# Patient Record
Sex: Male | Born: 1960 | Race: Black or African American | Hispanic: No | Marital: Married | Smoking: Current every day smoker
Health system: Southern US, Community
[De-identification: ages and names within clinical notes are randomized; demographics above are authoritative.]

## PROBLEM LIST (undated history)

## (undated) DIAGNOSIS — G8929 Other chronic pain: Secondary | ICD-10-CM

## (undated) DIAGNOSIS — K219 Gastro-esophageal reflux disease without esophagitis: Secondary | ICD-10-CM

## (undated) DIAGNOSIS — G4730 Sleep apnea, unspecified: Secondary | ICD-10-CM

## (undated) DIAGNOSIS — R51 Headache: Secondary | ICD-10-CM

## (undated) DIAGNOSIS — M549 Dorsalgia, unspecified: Secondary | ICD-10-CM

## (undated) DIAGNOSIS — G5621 Lesion of ulnar nerve, right upper limb: Principal | ICD-10-CM

## (undated) DIAGNOSIS — R519 Headache, unspecified: Secondary | ICD-10-CM

## (undated) HISTORY — PX: BACK SURGERY: SHX140

## (undated) HISTORY — DX: Dorsalgia, unspecified: M54.9

## (undated) HISTORY — DX: Lesion of ulnar nerve, right upper limb: G56.21

## (undated) HISTORY — DX: Other chronic pain: G89.29

---

## 2006-04-14 ENCOUNTER — Encounter: Admission: RE | Admit: 2006-04-14 | Discharge: 2006-04-14 | Payer: Self-pay | Admitting: Neurology

## 2006-05-06 ENCOUNTER — Encounter: Admission: RE | Admit: 2006-05-06 | Discharge: 2006-05-06 | Payer: Self-pay | Admitting: Neurology

## 2006-08-30 ENCOUNTER — Encounter: Admission: RE | Admit: 2006-08-30 | Discharge: 2006-08-30 | Payer: Self-pay | Admitting: Neurology

## 2007-01-13 ENCOUNTER — Encounter: Admission: RE | Admit: 2007-01-13 | Discharge: 2007-01-13 | Payer: Self-pay | Admitting: Neurology

## 2007-01-27 ENCOUNTER — Encounter: Admission: RE | Admit: 2007-01-27 | Discharge: 2007-01-27 | Payer: Self-pay | Admitting: Neurology

## 2007-04-01 ENCOUNTER — Encounter: Admission: RE | Admit: 2007-04-01 | Discharge: 2007-04-01 | Payer: Self-pay | Admitting: Neurology

## 2007-08-24 ENCOUNTER — Encounter: Admission: RE | Admit: 2007-08-24 | Discharge: 2007-08-24 | Payer: Self-pay | Admitting: Neurology

## 2009-02-26 ENCOUNTER — Ambulatory Visit (HOSPITAL_COMMUNITY): Admission: RE | Admit: 2009-02-26 | Discharge: 2009-02-27 | Payer: Self-pay | Admitting: Neurosurgery

## 2009-05-02 ENCOUNTER — Encounter: Admission: RE | Admit: 2009-05-02 | Discharge: 2009-05-02 | Payer: Self-pay | Admitting: Neurosurgery

## 2009-06-05 ENCOUNTER — Encounter: Admission: RE | Admit: 2009-06-05 | Discharge: 2009-06-05 | Payer: Self-pay | Admitting: Neurosurgery

## 2009-06-19 ENCOUNTER — Encounter: Admission: RE | Admit: 2009-06-19 | Discharge: 2009-06-19 | Payer: Self-pay | Admitting: Neurosurgery

## 2009-09-06 ENCOUNTER — Encounter: Payer: Self-pay | Admitting: Orthopedic Surgery

## 2009-09-06 ENCOUNTER — Ambulatory Visit (HOSPITAL_COMMUNITY): Admission: RE | Admit: 2009-09-06 | Discharge: 2009-09-06 | Payer: Self-pay | Admitting: Neurology

## 2009-10-07 ENCOUNTER — Ambulatory Visit: Payer: Self-pay | Admitting: Orthopedic Surgery

## 2009-10-07 DIAGNOSIS — M67919 Unspecified disorder of synovium and tendon, unspecified shoulder: Secondary | ICD-10-CM | POA: Insufficient documentation

## 2009-10-07 DIAGNOSIS — M719 Bursopathy, unspecified: Secondary | ICD-10-CM

## 2009-11-20 ENCOUNTER — Encounter: Payer: Self-pay | Admitting: Orthopedic Surgery

## 2009-12-04 ENCOUNTER — Encounter: Payer: Self-pay | Admitting: Orthopedic Surgery

## 2010-03-15 ENCOUNTER — Encounter: Payer: Self-pay | Admitting: Neurology

## 2010-03-16 ENCOUNTER — Encounter: Payer: Self-pay | Admitting: Neurology

## 2010-03-25 NOTE — Letter (Signed)
Summary: Medical record request Disab Determination  Medical record request Disab Determination   Imported By: Cammie Sickle 12/27/2009 12:24:15  _____________________________________________________________________  External Attachment:    Type:   Image     Comment:   External Document

## 2010-03-25 NOTE — Letter (Signed)
Summary: Medical record request Disability Determ  Medical record request Disability Determ   Imported By: Cammie Sickle 01/22/2010 18:44:30  _____________________________________________________________________  External Attachment:    Type:   Image     Comment:   External Document

## 2010-03-25 NOTE — Assessment & Plan Note (Signed)
Summary: rt shoulder pain/xrays [facility]/[facility]   Vital Signs:  Patient profile:   50 year old male Height:      73 inches Weight:      306 pounds Pulse rate:   78 / minute Resp:     18 per minute  Vitals Entered By: Fuller Canada MD (October 07, 2009 11:32 AM)  Visit Type:  new patient Referring Provider:  Dr. Gerilyn Pilgrim Primary Provider:  A. Leavy Cella, [facility] [place]  CC:  right shoulder pain.  History of Present Illness: I saw Omar Bailey in the office today for an initial visit.  He is a 50 years old man with the complaint of:  right shoulder pain.  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  He had an MRI done in July of this year and it showed he had a rim rent tear of the supraspinatus which is seensupraspinatus and infraspinatus tendinitis and a.c. joint inflammation.  He seems to be having more pain than that.  He is taking Percocet 7.5 mg and that doesn't help his shoulder.  He has sharp stabbing constant pain in the RIGHT shoulder and loss of motion with no history of trauma.  At the [facility] he had an MRI of his neck and was told he had arthritis there.  He has not had physical therapy or injection in the shoulder he says his pain is 9/10 and 10 out of 10 when he tries to move his shoulder or lift his arm  Meds: Imitrex, Gabapentin, Flexeril, Stadol, Actonel, Nortriptyline, Allegra, Flonase, Percocet from [place].  the nerve study showed that he has a cubital tunnel type lesion although symptoms seem to be median nerve I do not have the study       Allergies (verified): 1)  ! Sulfa  Past History:  Past Medical History: back migraines rt shoulder  Past Surgical History: lower back 02/26/09  Family History: FH of Cancer:  Family History of Arthritis Hx, family, chronic respiratory condition  Social History: Patient is married.  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 Cardiovascular:  Denies chest pain, palpitations, fainting, and murmurs. Respiratory:  Denies short of breath, wheezing, couch, tightness, pain on inspiration, and snoring . Gastrointestinal:  Denies heartburn, nausea, vomiting, diarrhea, constipation, and blood in your stools. Genitourinary:  Denies frequency, urgency, difficulty urinating, painful urination, flank pain, and bleeding in urine. Neurologic:  Complains of numbness and tremors; denies tingling, unsteady gait, dizziness, and seizure. Musculoskeletal:  Complains of joint pain, instability, stiffness, and muscle pain; denies swelling, redness, and heat. Endocrine:  Denies excessive thirst, exessive urination, and heat or cold intolerance. Psychiatric:  Denies nervousness, depression, anxiety, and hallucinations. Skin:  Denies changes in the skin, poor healing, rash, itching, and redness. HEENT:  Denies blurred or double vision, eye pain, redness, and watering. Immunology:  Complains of seasonal allergies; denies sinus problems and allergic to bee stings. Hemoatologic:  Denies easy bleeding and brusing.  Physical Exam  Additional Exam:  vital signs as recorded he is a large male he is well-developed vacuuming and hygiene are normal  He has normal perfusion and his upper extremity good pulse no swelling  Cervical spine no lymph nodes are palpable, clavicular region and axilla normal.  Skin normal neck shoulder and back.  Neuro  has normal reflexes  Psychiatric he's awake alert and oriented x3 his mood and affect is normal  He ambulates with a cane in the RIGHT hand has been advised to switch it to the LEFT  On inspection there is no specific tenderness around the shoulder but he has active range of motion of 90 forward elevation 50 external rotation and internal rotation only to his front  pocket passively flexion equals 150 painful area pain in the arc of 90-150.  Impingement sign positive.  However he had a normal supraspinatus resistance test grade 5 strength.  External rotation and internal rotation grade 5 strength.  He had painful passive internal rotation to the back pocket.  The shoulder was stable.     Impression & Recommendations:  Problem # 1:  ROTATOR CUFF SYNDROME, RIGHT (ICD-726.10) Assessment New  impingement syndrome rotator cuff syndrome RIGHT shoulder with small rotator cuff tear  RIGHT shoulder subacromial space injection   Verbal consent obtained/The shoulder was injected with depomedrol 40mg /cc and sensorcaine .25% . There were no complications RIGHT shoulder  RIGHT shoulder AP lateral x-ray  in the acromion is type II.  TheArch is slightly altered with suggestive proximal migration of the shoulder.  Impression normal glenohumeral joint with type II acromion  MRI report and films reviewed small partial-thickness tear with some a.c. joint inflammation  I do not think I can make this better with surgery.  He should have physical therapy first subacromial injection was given.  I explained this to him.  I will see him in 2 months he how he does with the therapy.  He does not appear to be a great surgical candidate.  Orders: Physical Therapy Referral (PT) New Patient Level III (98119) Joint Aspirate / Injection, Large (20610) Depo- Medrol 40mg  (J1030) Shoulder x-ray,  minimum 2 views (14782)  Problem # 2:  ROTATOR CUFF INJURY, RIGHT SHOULDER (ICD-726.10) Assessment: New  Patient Instructions: 1)  PT AT [facility]  2)  You have received an injection of cortisone today. You may experience increased pain at the injection site. Apply ice pack to the area for 20 minutes every 2 hours and take 2 xtra strength tylenol every 8 hours. This increased pain will usually resolve in 24 hours. The injection will take effect in 3-10 days.  3)  You have a  small tear in your rotator cuff. If does not seem to need repair. So I am recommending therapy 1st  4)  Please schedule a follow-up appointment in 2 months.

## 2010-03-25 NOTE — Letter (Signed)
Summary: History form  History form   Imported By: Omar Bailey 10/08/2009 12:07:42  _____________________________________________________________________  External Attachment:    Type:   Image     Comment:   External Document

## 2010-05-26 LAB — CBC
HCT: 48.8 % (ref 39.0–52.0)
MCHC: 34.3 g/dL (ref 30.0–36.0)
RBC: 4.71 MIL/uL (ref 4.22–5.81)
RDW: 13.5 % (ref 11.5–15.5)

## 2015-05-13 ENCOUNTER — Encounter: Payer: Self-pay | Admitting: Neurology

## 2015-05-13 ENCOUNTER — Ambulatory Visit (INDEPENDENT_AMBULATORY_CARE_PROVIDER_SITE_OTHER): Payer: Federal, State, Local not specified - PPO | Admitting: Neurology

## 2015-05-13 ENCOUNTER — Ambulatory Visit (INDEPENDENT_AMBULATORY_CARE_PROVIDER_SITE_OTHER): Payer: Self-pay | Admitting: Neurology

## 2015-05-13 DIAGNOSIS — G5621 Lesion of ulnar nerve, right upper limb: Secondary | ICD-10-CM | POA: Diagnosis not present

## 2015-05-13 HISTORY — DX: Lesion of ulnar nerve, right upper limb: G56.21

## 2015-05-13 NOTE — Progress Notes (Signed)
Please refer to EMG and nerve conduction study procedure note. 

## 2015-05-13 NOTE — Procedures (Signed)
     HISTORY:  Annita Brodony Chenier is a 55 year old gentleman with a history of numbness in the fourth and fifth fingers of the right hand since May 2016. The patient has weakness in the hand, dropping things at times. He is being evaluated for this issue. He has also reports some neck and shoulder discomfort and pain down the right arm.  NERVE CONDUCTION STUDIES:  Nerve conduction studies were performed on both upper extremities. The distal motor latencies and motor amplitudes for the median nerves were normal bilaterally. The distal motor latencies for the ulnar nerves were normal bilaterally with a low motor amplitudes on the right, normal on the left. The F wave latencies for the median nerves and for the left ulnar nerve were normal, prolonged on the right. The nerve conduction velocities for the median nerves bilaterally and for the left ulnar nerve were normal, slowed above and below the elbow on the right. The sensory latencies for the median nerves were normal bilaterally, and normal for the left ulnar nerve, prolonged for the right ulnar nerve.  EMG STUDIES:  EMG study was performed on the right upper extremity:  The first dorsal interosseous muscle reveals 2 to 4 K units with significantly reduced recruitment. 3+ fibrillations and positive waves were noted. The abductor pollicis brevis muscle reveals 2 to 4 K units with full recruitment. No fibrillations or positive waves were noted. The extensor indicis proprius muscle reveals 1 to 3 K units with full recruitment. No fibrillations or positive waves were noted. The pronator teres muscle reveals 2 to 3 K units with full recruitment. No fibrillations or positive waves were noted.  The flexor digitorum profundus muscle (III-IV) reveals 2 to 3 K units with full recruitment. No fibrillations or positive waves were seen. The biceps muscle reveals 1 to 2 K units with full recruitment. No fibrillations or positive waves were noted. The triceps  muscle reveals 2 to 4 K units with full recruitment. No fibrillations or positive waves were noted. The anterior deltoid muscle reveals 2 to 3 K units with full recruitment. No fibrillations or positive waves were noted. The cervical paraspinal muscles were tested at 2 levels. No abnormalities of insertional activity were seen at either level tested. There was poor relaxation.   IMPRESSION:  Nerve conduction studies done on the upper extremities shows evidence of a significant right ulnar neuropathy. EMG evaluation of the right upper extremity shows findings consistent with a right ulnar neuropathy, but there appears to be sparing of the flexor digitorum profundus muscle suggesting that the lesion in the ulnar nerve is distal to the cubital tunnel. There is no evidence of an overlying cervical radiculopathy.  Marlan Palau. Keith Willis MD 05/13/2015 1:34 PM  [facility] [address] [place], [place] [id number]  Phone [phone] Fax [phone]

## 2015-05-15 ENCOUNTER — Encounter: Payer: Federal, State, Local not specified - PPO | Admitting: Neurology

## 2017-01-06 ENCOUNTER — Ambulatory Visit: Payer: Federal, State, Local not specified - PPO | Admitting: Gastroenterology

## 2017-01-06 ENCOUNTER — Other Ambulatory Visit: Payer: Self-pay | Admitting: *Deleted

## 2017-01-06 ENCOUNTER — Telehealth: Payer: Self-pay | Admitting: *Deleted

## 2017-01-06 ENCOUNTER — Encounter: Payer: Self-pay | Admitting: *Deleted

## 2017-01-06 ENCOUNTER — Encounter: Payer: Self-pay | Admitting: Gastroenterology

## 2017-01-06 DIAGNOSIS — R1013 Epigastric pain: Secondary | ICD-10-CM

## 2017-01-06 DIAGNOSIS — K5900 Constipation, unspecified: Secondary | ICD-10-CM

## 2017-01-06 DIAGNOSIS — R634 Abnormal weight loss: Secondary | ICD-10-CM | POA: Insufficient documentation

## 2017-01-06 DIAGNOSIS — R93429 Abnormal radiologic findings on diagnostic imaging of unspecified kidney: Secondary | ICD-10-CM | POA: Diagnosis not present

## 2017-01-06 DIAGNOSIS — R933 Abnormal findings on diagnostic imaging of other parts of digestive tract: Secondary | ICD-10-CM

## 2017-01-06 DIAGNOSIS — D7589 Other specified diseases of blood and blood-forming organs: Secondary | ICD-10-CM | POA: Diagnosis not present

## 2017-01-06 DIAGNOSIS — K838 Other specified diseases of biliary tract: Secondary | ICD-10-CM

## 2017-01-06 DIAGNOSIS — K625 Hemorrhage of anus and rectum: Secondary | ICD-10-CM

## 2017-01-06 MED ORDER — PANTOPRAZOLE SODIUM 40 MG PO TBEC: 40.0000 mg | DELAYED_RELEASE_TABLET | Freq: Two times a day (BID) | ORAL | 5 refills | Status: AC

## 2017-01-06 MED ORDER — PEG 3350-KCL-NA BICARB-NACL 420 G PO SOLR: 4000.0000 mL | Freq: Once | ORAL | 0 refills | Status: AC

## 2017-01-06 MED ORDER — LUBIPROSTONE 24 MCG PO CAPS: 24.0000 ug | ORAL_CAPSULE | Freq: Two times a day (BID) | ORAL | 5 refills | Status: DC

## 2017-01-06 NOTE — Progress Notes (Signed)
requested

## 2017-01-06 NOTE — Progress Notes (Signed)
Primary Care Physician:  Encarnacion SlatesBoyd, Amy H, PA-C  Primary Gastroenterologist:  Jonette EvaSandi Fields, MD   Chief Complaint  Patient presents with  . Abdominal Pain    for several months  . Rectal Bleeding    stools black; never had tcs  . Constipation    HPI:  Omar Bailey is a 56 y.o. male here for further evaluation of abdominal pain, rectal bleeding, constipation.  He was advised to make an appointment with us after [facility] ED [facility].   Most recently seen on November 5 at their facility.  He complains of 1921-month history of epigastric pain/left upper quadrant pain, lower abdominal discomfort.  Associated with decreased appetite but no vomiting.  Feels like his food is not digesting well, complains of early satiety and bloating.  He will go days without a BM.  Occasionally uses milk of magnesia.  Has had to disimpact himself.  Noted bright red blood per rectum when he did this.  Stools have been black off and on for months.  He notes that he has used Pepto-Bismol at least recently.  He has had a lot of heartburn.  Some solid food dysphasia.  Recently treated for bronchitis with prednisone and Levaquin.  Far as his constipation is had issues mostly over the past year.  He has been on chronic pain medication for at least 3 years.  Recently found to have H. pylori to the ED.Treated with amoxicillin, clarithromycin.  Patient believes he took pantoprazole 40 mg twice daily with it.  I received records from the latest ED visit dated 12/28/2016.  CT that day outlined below but basically indicated interval improvement in gastric wall thickening but not resolved recommending close endoscopic follow-up to rule out underlying malignancy.  He had stable appearance of mild distention of the extrahepatic bile duct measuring up to 11 mm.  Gallbladder unremarkable.  2 small renal lesions noted.  Labs indicated no evidence of anemia but he had macrocytosis with MCV of 105.  No prior EGD or colonoscopy.    No asa/nsaids.  Only occasional alcohol use, last beer in the summer   Patient reports 40 pound weight loss since July.  Current Outpatient Medications  Medication Sig Dispense Refill  . gabapentin (NEURONTIN) 300 MG capsule Take 1-2 capsules as needed by mouth. Takes 5-6 per day    . HYDROcodone-acetaminophen (NORCO/VICODIN) 5-325 MG tablet Take 1 tablet every 6 (six) hours as needed by mouth for moderate pain.    . SUMAtriptan (IMITREX) 6 MG/0.5ML SOLN injection Inject 6 mg as needed into the skin for migraine or headache. May repeat in 2 hours if headache persists or recurs.     No current facility-administered medications for this visit.     Allergies as of 01/06/2017 - Review Complete 01/06/2017  Allergen Reaction Noted  . Sulfonamide derivatives      Past Medical History:  Diagnosis Date  . Chronic back pain   . Chronic pain    right shoulder, elbow, wrist  . Ulnar neuropathy of right upper extremity 05/13/2015    Past Surgical History:  Procedure Laterality Date  . BACK SURGERY      Family History  Problem Relation Age of Onset  . Colon cancer Neg Hx   . Ulcers Neg Hx     Social History   Socioeconomic History  . Marital status: Married    Spouse name: Not on file  . Number of children: Not on file  . Years of education: Not on file  .  Highest education level: Not on file  Social Needs  . Financial resource strain: Not on file  . Food insecurity - worry: Not on file  . Food insecurity - inability: Not on file  . Transportation needs - medical: Not on file  . Transportation needs - non-medical: Not on file  Occupational History  . Not on file  Tobacco Use  . Smoking status: Former Smoker    Types: Cigarettes  . Smokeless tobacco: Never Used  Substance and Sexual Activity  . Alcohol use: Yes    Comment: occ beer  . Drug use: No  . Sexual activity: Not on file  Other Topics Concern  . Not on file  Social History Narrative  . Not on file       ROS:  General: Negative for  fever, chills, fatigue, weakness.  See HPI Eyes: Negative for vision changes.  ENT: Negative for hoarseness, difficulty swallowing , nasal congestion. CV: Negative for chest pain, angina, palpitations, dyspnea on exertion, peripheral edema.  Respiratory: Negative for dyspnea at rest, dyspnea on exertion, cough, sputum, wheezing.  GI: See history of present illness. GU:  Negative for dysuria, hematuria, urinary incontinence, urinary frequency, nocturnal urination.  MS: Chronic joint pain (right sided shoulder, elbow, wrist) Janese Banks/back pain \ Derm: Negative for rash or itching.  Neuro: Negative for weakness, abnormal sensation, seizure, frequent headaches, memory loss, confusion.  Psych: Negative for anxiety, depression, suicidal ideation, hallucinations.  Endo: See HPI Heme: Negative for bruising or bleeding. Allergy: Negative for rash or hives.    Physical Examination:  BP 129/78   Pulse 98   Temp (!) 97.5 F (36.4 C) (Oral)   Ht 6' (1.829 m)   Wt 218 lb 12.8 oz (99.2 kg)   BMI 29.67 kg/m    General: Well-nourished, well-developed in no acute distress.  Scented with wife Head: Normocephalic, atraumatic.   Eyes: Conjunctiva pink, no icterus. Mouth: Oropharyngeal mucosa moist and pink , no lesions erythema or exudate. Neck: Supple without thyromegaly, masses, or lymphadenopathy.  Lungs: Clear to auscultation bilaterally.  Heart: Regular rate and rhythm, no murmurs rubs or gallops.  Abdomen: Bowel sounds are normal, moderate epigastric tenderness, nondistended, no hepatosplenomegaly or masses, no abdominal bruits or    hernia , no rebound or guarding.   Rectal: Deferred Extremities: No lower extremity edema. No clubbing or deformities.  Neuro: Alert and oriented x 4 , grossly normal neurologically.  Skin: Warm and dry, no rash or jaundice.   Psych: Alert and cooperative, normal mood and affect.  Labs: Labs dated 12/28/2016 at [facility]  ER  Glucose 123, BUN 11, creatinine 1.42, total bilirubin 0.5, alkaline phosphatase 96, AST 14.2, ALT 10, albumin 4.1, lipase 26, white blood cell count 9000, hemoglobin 17.8, MCV 105.5, platelets 314,000  Imaging Studies: CT abdomen and pelvis with contrast dated 12/28/2016 compared to [facility]  Asymmetric elevation left hemidiaphragm.  Mild distention extrahepatic common duct up to 11 mm, 5 mm nonobstructing renal stone in the right kidney, stomach is nondistended with gastric wall thickening seen on previous study appears to be improved but not resolved, tiny hypoattenuating lesion lower pole right kidney stable.  14 mm hypoattenuating lesion in the interpolar left kidney suggestive of cyst.

## 2017-01-06 NOTE — Assessment & Plan Note (Signed)
2 small spots on both kidneys.  Obtain copy of prior CT reports.  Further recommendations to follow.

## 2017-01-06 NOTE — Progress Notes (Signed)
cc'ed to pcp °

## 2017-01-06 NOTE — Telephone Encounter (Signed)
[facility] Pre-op appt scheduled for 01/28/17 at 11:00am over at [facility] short stay. Letter mailed to pt.

## 2017-01-06 NOTE — Assessment & Plan Note (Signed)
Possibly related to opioids although he states he tolerated well up until this past year.  Begin Amitiza 24 mcg twice daily with food.  Samples and prescription provided.  Reiterated need to adequately manage constipation especially prior to colonoscopy bowel prep.  Patient voiced understanding.

## 2017-01-06 NOTE — Assessment & Plan Note (Addendum)
Several month history of progressive epigastric pain associated with early satiety, new onset heartburn, solid food dysphagia, 40 pound weight loss.  CT scan with abnormality of the stomach noted.  Questionable melena although hemoglobin has been normal.  Recommend upper endoscopy with possible esophageal dilation in the near future Dr. Darrick PennaFields, deep sedation given chronic narcotics.  I have discussed the risks, alternatives, benefits with regards to but not limited to the risk of reaction to medication, bleeding, infection, perforation and the patient is agreeable to proceed. Written consent to be obtained.  Resume pantoprazole at 40 mg twice daily before meals.  Prescription sent to pharmacy.  Recommended completing Carafate tablets that he already has but make suspension.  Bland diet.

## 2017-01-06 NOTE — Assessment & Plan Note (Signed)
Rectal bleeding in the setting of constipation.  No prior colonoscopy.  Recommend colonoscopy in the near future.  Deep sedation given narcotics.  I have discussed the risks, alternatives, benefits with regards to but not limited to the risk of reaction to medication, bleeding, infection, perforation and the patient is agreeable to proceed. Written consent to be obtained.

## 2017-01-06 NOTE — Assessment & Plan Note (Signed)
Denies alcohol use.  Check B12 and folate.

## 2017-01-06 NOTE — Assessment & Plan Note (Signed)
Common bile duct 11 mm without detectable etiology on CT scan.  LFTs unremarkable.  Will likely need MRCP at a later date after  endoscopic evaluation.

## 2017-01-06 NOTE — Progress Notes (Addendum)
Darl PikesSusan, please request copy of ultrasounds or CT scans of the [facility] done in last 6 months.

## 2017-01-06 NOTE — Patient Instructions (Signed)
1. Start Amitiza 24 mcg twice daily with food for constipation.  Samples provided.  Prescription sent to pharmacy. 2. Start pantoprazole 40 mg twice daily, 30 minutes before breakfast and 30 minutes before evening meal for your stomach.  Prescription sent to pharmacy. 3. Crush sulcrafate (use what you already have at home) in small amount of water and take one before meals three times a day.  4. Upper endoscopy and colonoscopy as outlined.  Please see separate instructions.  Make sure your bowels are moving well prior to your bowel prep for your procedure.  If you are having troubles let me know. 5. See below diet.   Bland Diet A bland diet consists of foods that do not have a lot of fat or fiber. Foods without fat or fiber are easier for the body to digest. They are also less likely to irritate your mouth, throat, stomach, and other parts of your gastrointestinal tract. A bland diet is sometimes called a BRAT diet. What is my plan? Your health care provider or dietitian may recommend specific changes to your diet to prevent and treat your symptoms, such as:  Eating small meals often.  Cooking food until it is soft enough to chew easily.  Chewing your food well.  Drinking fluids slowly.  Not eating foods that are very spicy, sour, or fatty.  Not eating citrus fruits, such as oranges and grapefruit.  What do I need to know about this diet?  Eat a variety of foods from the bland diet food list.  Do not follow a bland diet longer than you have to.  Ask your health care provider whether you should take vitamins. What foods can I eat? Grains  Hot cereals, such as cream of wheat. Bread, crackers, or tortillas made from refined white flour. Rice. Vegetables Canned or cooked vegetables. Mashed or boiled potatoes. Fruits Bananas. Applesauce. Other types of cooked or canned fruit with the skin and seeds removed, such as canned peaches or pears. Meats and Other Protein Sources Scrambled  eggs. Creamy peanut butter or other nut butters. Lean, well-cooked meats, such as chicken or fish. Tofu. Soups or broths. Dairy Low-fat dairy products, such as milk, cottage cheese, or yogurt. Beverages Water. Herbal tea. Apple juice. Sweets and Desserts Pudding. Custard. Fruit gelatin. Ice cream. Fats and Oils Mild salad dressings. Canola or olive oil. The items listed above may not be a complete list of allowed foods or beverages. Contact your dietitian for more options. What foods are not recommended? Foods and ingredients that are often not recommended include:  Spicy foods, such as hot sauce or salsa.  Fried foods.  Sour foods, such as pickled or fermented foods.  Raw vegetables or fruits, especially citrus or berries.  Caffeinated drinks.  Alcohol.  Strongly flavored seasonings or condiments.  The items listed above may not be a complete list of foods and beverages that are not allowed. Contact your dietitian for more information. This information is not intended to replace advice given to you by your health care provider. Make sure you discuss any questions you have with your health care provider. Document Released: 06/03/2015 Document Revised: 07/18/2015 Document Reviewed: 02/21/2014 Elsevier Interactive Patient Education  2018 [employer]

## 2017-01-11 ENCOUNTER — Telehealth: Payer: Self-pay | Admitting: Gastroenterology

## 2017-01-11 NOTE — Telephone Encounter (Signed)
Pt is needing Amitiza samples to hold him until the pharmacy/insurance gets worked out. [phone]

## 2017-01-12 NOTE — Telephone Encounter (Signed)
I called and spoke to pt's wife, pt is asleep. She said she did not know what kind of problem with insurance, but she will call the pharmacy and have them contact us if he needs PA or let us know what we need to do. I am leaving Amitiza 24 mcg # 12 at front for pick up.

## 2017-01-12 NOTE — Telephone Encounter (Signed)
FYI to Tana CoastLeslie Lewis, PA who saw pt in the office.

## 2017-01-12 NOTE — Telephone Encounter (Signed)
Noted  

## 2017-01-13 ENCOUNTER — Telehealth: Payer: Self-pay | Admitting: Gastroenterology

## 2017-01-13 NOTE — Telephone Encounter (Signed)
Pt's wife called to say that the prescription of Amitiza was too expensive and would cost them $186. She was asking if there's something else not so expensive the provider could call in to [facility]. Please advise [phone]

## 2017-01-13 NOTE — Telephone Encounter (Signed)
PT's wife is aware I will send note to provider. But I am also leaving samples of the Amitiza 24 mcg #12 at front since we are going into holidays. She said she will be by in the next 30 min to pick up.

## 2017-01-17 MED ORDER — LINACLOTIDE 290 MCG PO CAPS: 290.0000 ug | ORAL_CAPSULE | Freq: Every day | ORAL | 5 refills | Status: AC

## 2017-01-17 NOTE — Telephone Encounter (Signed)
Will send in [employer], I'm not sure what is on his formulary. Have them call if any problems.

## 2017-01-17 NOTE — Addendum Note (Signed)
Addended by: Tiffany KocherLEWIS, Janeisha Ryle S on: 01/17/2017 09:45 AM   Modules accepted: Orders

## 2017-01-18 ENCOUNTER — Telehealth: Payer: Self-pay | Admitting: Gastroenterology

## 2017-01-18 NOTE — Telephone Encounter (Signed)
[phone] please call patient, his prescription is very expensive and wants to know if we have a discount card that can be used.

## 2017-01-19 NOTE — Telephone Encounter (Signed)
LMOM to call.

## 2017-01-19 NOTE — Telephone Encounter (Signed)
Returned call and LMOM for a return call.  

## 2017-01-19 NOTE — Telephone Encounter (Signed)
Patient spouse. calling back wanting to discuss Rx sent in. Linzess is still expensive.

## 2017-01-19 NOTE — Telephone Encounter (Signed)
See separate message.

## 2017-01-20 NOTE — Telephone Encounter (Signed)
PT's wife said she paid $116.00 for the Linzess and they would like to know if there is anything else he can get cheaper.  Forwarding to Tana CoastLeslie Lewis, PA who saw pt in the office.

## 2017-01-20 NOTE — Telephone Encounter (Signed)
Please find out what is on his formulary.

## 2017-01-21 NOTE — Telephone Encounter (Signed)
I called [facility] and they said they only got the rejection. York SpanielSaid it is best if the pt calls and asks what else is on the formulary. I called and told pt's wife and she is going to check it out.  Also, I told them we have the savings cards and to ask if he would be eligible for future refills. They will let us know when they find out.

## 2017-01-26 NOTE — Patient Instructions (Signed)
Omar Bailey  01/26/2017     @PREFPERIOPPHARMACY @   Your procedure is scheduled on  02/02/2017 .  Report to [facility] at  1000  A.M.  Call this number if you have problems the morning of surgery:  [phone]   Remember:  Do not eat food or drink liquids after midnight.  Take these medicines the morning of surgery with A SIP OF WATER  Neurontin, hydrocodone, protonix.   Do not wear jewelry, make-up or nail polish.  Do not wear lotions, powders, or perfumes, or deoderant.  Do not shave 48 hours prior to surgery.  Men may shave face and neck.  Do not bring valuables to the hospital.  [facility] is not responsible for any belongings or valuables.  Contacts, dentures or bridgework may not be worn into surgery.  Leave your suitcase in the car.  After surgery it may be brought to your room.  For patients admitted to the hospital, discharge time will be determined by your treatment team.  Patients discharged the day of surgery will not be allowed to drive home.   Name and phone number of your driver:   family Special instructions:  Follow the diet and prep instructions given to you by Dr Evelina Dun office.  Please read over the following fact sheets that you were given. Anesthesia Post-op Instructions and Care and Recovery After Surgery       Esophagogastroduodenoscopy Esophagogastroduodenoscopy (EGD) is a procedure to examine the lining of the esophagus, stomach, and first part of the small intestine (duodenum). This procedure is done to check for problems such as inflammation, bleeding, ulcers, or growths. During this procedure, a long, flexible, lighted tube with a camera attached (endoscope) is inserted down the throat. Tell a health care provider about:  Any allergies you have.  All medicines you are taking, including vitamins, herbs, eye drops, creams, and over-the-counter medicines.  Any problems you or family members have had with  anesthetic medicines.  Any blood disorders you have.  Any surgeries you have had.  Any medical conditions you have.  Whether you are pregnant or may be pregnant. What are the risks? Generally, this is a safe procedure. However, problems may occur, including:  Infection.  Bleeding.  A tear (perforation) in the esophagus, stomach, or duodenum.  Trouble breathing.  Excessive sweating.  Spasms of the larynx.  A slowed heartbeat.  Low blood pressure.  What happens before the procedure?  Follow instructions from your health care provider about eating or drinking restrictions.  Ask your health care provider about: ? Changing or stopping your regular medicines. This is especially important if you are taking diabetes medicines or blood thinners. ? Taking medicines such as aspirin and ibuprofen. These medicines can thin your blood. Do not take these medicines before your procedure if your health care provider instructs you not to.  Plan to have someone take you home after the procedure.  If you wear dentures, be ready to remove them before the procedure. What happens during the procedure?  To reduce your risk of infection, your health care team will wash or sanitize their hands.  An IV tube will be put in a vein in your hand or arm. You will get medicines and fluids through this tube.  You will be given one or more of the following: ? A medicine to help you relax (sedative). ? A medicine to numb the area (local  anesthetic). This medicine may be sprayed into your throat. It will make you feel more comfortable and keep you from gagging or coughing during the procedure. ? A medicine for pain.  A mouth guard may be placed in your mouth to protect your teeth and to keep you from biting on the endoscope.  You will be asked to lie on your left side.  The endoscope will be lowered down your throat into your esophagus, stomach, and duodenum.  Air will be put into the endoscope.  This will help your health care provider see better.  The lining of your esophagus, stomach, and duodenum will be examined.  Your health care provider may: ? Take a tissue sample so it can be looked at in a lab (biopsy). ? Remove growths. ? Remove objects (foreign bodies) that are stuck. ? Treat any bleeding with medicines or other devices that stop tissue from bleeding. ? Widen (dilate) or stretch narrowed areas of your esophagus and stomach.  The endoscope will be taken out. The procedure may vary among health care providers and hospitals. What happens after the procedure?  Your blood pressure, heart rate, breathing rate, and blood oxygen level will be monitored often until the medicines you were given have worn off.  Do not eat or drink anything until the numbing medicine has worn off and your gag reflex has returned. This information is not intended to replace advice given to you by your health care provider. Make sure you discuss any questions you have with your health care provider. Document Released: 06/12/2004 Document Revised: 07/18/2015 Document Reviewed: 01/03/2015 Elsevier Interactive Patient Education  2018 [employer]. Esophagogastroduodenoscopy, Care After Refer to this sheet in the next few weeks. These instructions provide you with information about caring for yourself after your procedure. Your health care provider may also give you more specific instructions. Your treatment has been planned according to current medical practices, but problems sometimes occur. Call your health care provider if you have any problems or questions after your procedure. What can I expect after the procedure? After the procedure, it is common to have:  A sore throat.  Nausea.  Bloating.  Dizziness.  Fatigue.  Follow these instructions at home:  Do not eat or drink anything until the numbing medicine (local anesthetic) has worn off and your gag reflex has returned. You will know  that the local anesthetic has worn off when you can swallow comfortably.  Do not drive for 24 hours if you received a medicine to help you relax (sedative).  If your health care provider took a tissue sample for testing during the procedure, make sure to get your test results. This is your responsibility. Ask your health care provider or the department performing the test when your results will be ready.  Keep all follow-up visits as told by your health care provider. This is important. Contact a health care provider if:  You cannot stop coughing.  You are not urinating.  You are urinating less than usual. Get help right away if:  You have trouble swallowing.  You cannot eat or drink.  You have throat or chest pain that gets worse.  You are dizzy or light-headed.  You faint.  You have nausea or vomiting.  You have chills.  You have a fever.  You have severe abdominal pain.  You have black, tarry, or bloody stools. This information is not intended to replace advice given to you by your health care provider. Make sure you discuss any  questions you have with your health care provider. Document Released: 01/27/2012 Document Revised: 07/18/2015 Document Reviewed: 01/03/2015 Elsevier Interactive Patient Education  2018 [employer].  Esophageal Dilatation Esophageal dilatation is a procedure to open a blocked or narrowed part of the esophagus. The esophagus is the long tube in your throat that carries food and liquid from your mouth to your stomach. The procedure is also called esophageal dilation. You may need this procedure if you have a buildup of scar tissue in your esophagus that makes it difficult, painful, or even impossible to swallow. This can be caused by gastroesophageal reflux disease (GERD). In rare cases, people need this procedure because they have cancer of the esophagus or a problem with the way food moves through the esophagus. Sometimes you may need to have  another dilatation to enlarge the opening of the esophagus gradually. Tell a health care provider about:  Any allergies you have.  All medicines you are taking, including vitamins, herbs, eye drops, creams, and over-the-counter medicines.  Any problems you or family members have had with anesthetic medicines.  Any blood disorders you have.  Any surgeries you have had.  Any medical conditions you have.  Any antibiotic medicines you are required to take before dental procedures. What are the risks? Generally, this is a safe procedure. However, problems can occur and include:  Bleeding from a tear in the lining of the esophagus.  A hole (perforation) in the esophagus.  What happens before the procedure?  Do not eat or drink anything after midnight on the night before the procedure or as directed by your health care provider.  Ask your health care provider about changing or stopping your regular medicines. This is especially important if you are taking diabetes medicines or blood thinners.  Plan to have someone take you home after the procedure. What happens during the procedure?  You will be given a medicine that makes you relaxed and sleepy (sedative).  A medicine may be sprayed or gargled to numb the back of the throat.  Your health care provider can use various instruments to do an esophageal dilatation. During the procedure, the instrument used will be placed in your mouth and passed down into your esophagus. Options include: ? Simple dilators. This instrument is carefully placed in the esophagus to stretch it. ? Guided wire bougies. In this method, a flexible tube (endoscope) is used to insert a wire into the esophagus. The dilator is passed over this wire to enlarge the esophagus. Then the wire is removed. ? Balloon dilators. An endoscope with a small balloon at the end is passed down into the esophagus. Inflating the balloon gently stretches the esophagus and opens it  up. What happens after the procedure?  Your blood pressure, heart rate, breathing rate, and blood oxygen level will be monitored often until the medicines you were given have worn off.  Your throat may feel slightly sore and will probably still feel numb. This will improve slowly over time.  You will not be allowed to eat or drink until the throat numbness has resolved.  If this is a same-day procedure, you may be allowed to go home once you have been able to drink, urinate, and sit on the edge of the bed without nausea or dizziness.  If this is a same-day procedure, you should have a friend or family member with you for the next 24 hours after the procedure. This information is not intended to replace advice given to you by your  health care provider. Make sure you discuss any questions you have with your health care provider. Document Released: 04/02/2005 Document Revised: 07/18/2015 Document Reviewed: 06/21/2013 Elsevier Interactive Patient Education  2017 [employer]  Colonoscopy, Adult A colonoscopy is an exam to look at the entire large intestine. During the exam, a lubricated, bendable tube is inserted into the anus and then passed into the rectum, colon, and other parts of the large intestine. A colonoscopy is often done as a part of normal colorectal screening or in response to certain symptoms, such as anemia, persistent diarrhea, abdominal pain, and blood in the stool. The exam can help screen for and diagnose medical problems, including:  Tumors.  Polyps.  Inflammation.  Areas of bleeding.  Tell a health care provider about:  Any allergies you have.  All medicines you are taking, including vitamins, herbs, eye drops, creams, and over-the-counter medicines.  Any problems you or family members have had with anesthetic medicines.  Any blood disorders you have.  Any surgeries you have had.  Any medical conditions you have.  Any problems you have had passing  stool. What are the risks? Generally, this is a safe procedure. However, problems may occur, including:  Bleeding.  A tear in the intestine.  A reaction to medicines given during the exam.  Infection (rare).  What happens before the procedure? Eating and drinking restrictions Follow instructions from your health care provider about eating and drinking, which may include:  A few days before the procedure - follow a low-fiber diet. Avoid nuts, seeds, dried fruit, raw fruits, and vegetables.  1-3 days before the procedure - follow a clear liquid diet. Drink only clear liquids, such as clear broth or bouillon, black coffee or tea, clear juice, clear soft drinks or sports drinks, gelatin dessert, and popsicles. Avoid any liquids that contain red or purple dye.  On the day of the procedure - do not eat or drink anything during the 2 hours before the procedure, or within the time period that your health care provider recommends.  Bowel prep If you were prescribed an oral bowel prep to clean out your colon:  Take it as told by your health care provider. Starting the day before your procedure, you will need to drink a large amount of medicated liquid. The liquid will cause you to have multiple loose stools until your stool is almost clear or light green.  If your skin or anus gets irritated from diarrhea, you may use these to relieve the irritation: ? Medicated wipes, such as adult wet wipes with aloe and vitamin E. ? A skin soothing-product like petroleum jelly.  If you vomit while drinking the bowel prep, take a break for up to 60 minutes and then begin the bowel prep again. If vomiting continues and you cannot take the bowel prep without vomiting, call your health care provider.  General instructions  Ask your health care provider about changing or stopping your regular medicines. This is especially important if you are taking diabetes medicines or blood thinners.  Plan to have someone  take you home from the hospital or clinic. What happens during the procedure?  An IV tube may be inserted into one of your veins.  You will be given medicine to help you relax (sedative).  To reduce your risk of infection: ? Your health care team will wash or sanitize their hands. ? Your anal area will be washed with soap.  You will be asked to lie on your side with  your knees bent.  Your health care provider will lubricate a long, thin, flexible tube. The tube will have a camera and a light on the end.  The tube will be inserted into your anus.  The tube will be gently eased through your rectum and colon.  Air will be delivered into your colon to keep it open. You may feel some pressure or cramping.  The camera will be used to take images during the procedure.  A small tissue sample may be removed from your body to be examined under a microscope (biopsy). If any potential problems are found, the tissue will be sent to a lab for testing.  If small polyps are found, your health care provider may remove them and have them checked for cancer cells.  The tube that was inserted into your anus will be slowly removed. The procedure may vary among health care providers and hospitals. What happens after the procedure?  Your blood pressure, heart rate, breathing rate, and blood oxygen level will be monitored until the medicines you were given have worn off.  Do not drive for 24 hours after the exam.  You may have a small amount of blood in your stool.  You may pass gas and have mild abdominal cramping or bloating due to the air that was used to inflate your colon during the exam.  It is up to you to get the results of your procedure. Ask your health care provider, or the department performing the procedure, when your results will be ready. This information is not intended to replace advice given to you by your health care provider. Make sure you discuss any questions you have with your  health care provider. Document Released: 02/07/2000 Document Revised: 12/11/2015 Document Reviewed: 04/23/2015 Elsevier Interactive Patient Education  2018 [employer].  Colonoscopy, Adult, Care After This sheet gives you information about how to care for yourself after your procedure. Your health care provider may also give you more specific instructions. If you have problems or questions, contact your health care provider. What can I expect after the procedure? After the procedure, it is common to have:  A small amount of blood in your stool for 24 hours after the procedure.  Some gas.  Mild abdominal cramping or bloating.  Follow these instructions at home: General instructions   For the first 24 hours after the procedure: ? Do not drive or use machinery. ? Do not sign important documents. ? Do not drink alcohol. ? Do your regular daily activities at a slower pace than normal. ? Eat soft, easy-to-digest foods. ? Rest often.  Take over-the-counter or prescription medicines only as told by your health care provider.  It is up to you to get the results of your procedure. Ask your health care provider, or the department performing the procedure, when your results will be ready. Relieving cramping and bloating  Try walking around when you have cramps or feel bloated.  Apply heat to your abdomen as told by your health care provider. Use a heat source that your health care provider recommends, such as a moist heat pack or a heating pad. ? Place a towel between your skin and the heat source. ? Leave the heat on for 20-30 minutes. ? Remove the heat if your skin turns bright red. This is especially important if you are unable to feel pain, heat, or cold. You may have a greater risk of getting burned. Eating and drinking  Drink enough fluid to keep your  urine clear or pale yellow.  Resume your normal diet as instructed by your health care provider. Avoid heavy or fried foods that  are hard to digest.  Avoid drinking alcohol for as long as instructed by your health care provider. Contact a health care provider if:  You have blood in your stool 2-3 days after the procedure. Get help right away if:  You have more than a small spotting of blood in your stool.  You pass large blood clots in your stool.  Your abdomen is swollen.  You have nausea or vomiting.  You have a fever.  You have increasing abdominal pain that is not relieved with medicine. This information is not intended to replace advice given to you by your health care provider. Make sure you discuss any questions you have with your health care provider. Document Released: 09/24/2003 Document Revised: 11/04/2015 Document Reviewed: 04/23/2015 Elsevier Interactive Patient Education  2018 [employer]  Monitored Anesthesia Care Anesthesia is a term that refers to techniques, procedures, and medicines that help a person stay safe and comfortable during a medical procedure. Monitored anesthesia care, or sedation, is one type of anesthesia. Your anesthesia specialist may recommend sedation if you will be having a procedure that does not require you to be unconscious, such as:  Cataract surgery.  A dental procedure.  A biopsy.  A colonoscopy.  During the procedure, you may receive a medicine to help you relax (sedative). There are three levels of sedation:  Mild sedation. At this level, you may feel awake and relaxed. You will be able to follow directions.  Moderate sedation. At this level, you will be sleepy. You may not remember the procedure.  Deep sedation. At this level, you will be asleep. You will not remember the procedure.  The more medicine you are given, the deeper your level of sedation will be. Depending on how you respond to the procedure, the anesthesia specialist may change your level of sedation or the type of anesthesia to fit your needs. An anesthesia specialist will monitor you  closely during the procedure. Let your health care provider know about:  Any allergies you have.  All medicines you are taking, including vitamins, herbs, eye drops, creams, and over-the-counter medicines.  Any use of steroids (by mouth or as a cream).  Any problems you or family members have had with sedatives and anesthetic medicines.  Any blood disorders you have.  Any surgeries you have had.  Any medical conditions you have, such as sleep apnea.  Whether you are pregnant or may be pregnant.  Any use of cigarettes, alcohol, or street drugs. What are the risks? Generally, this is a safe procedure. However, problems may occur, including:  Getting too much medicine (oversedation).  Nausea.  Allergic reaction to medicines.  Trouble breathing. If this happens, a breathing tube may be used to help with breathing. It will be removed when you are awake and breathing on your own.  Heart trouble.  Lung trouble.  Before the procedure Staying hydrated Follow instructions from your health care provider about hydration, which may include:  Up to 2 hours before the procedure - you may continue to drink clear liquids, such as water, clear fruit juice, black coffee, and plain tea.  Eating and drinking restrictions Follow instructions from your health care provider about eating and drinking, which may include:  8 hours before the procedure - stop eating heavy meals or foods such as meat, fried foods, or fatty foods.  6 hours  before the procedure - stop eating light meals or foods, such as toast or cereal.  6 hours before the procedure - stop drinking milk or drinks that contain milk.  2 hours before the procedure - stop drinking clear liquids.  Medicines Ask your health care provider about:  Changing or stopping your regular medicines. This is especially important if you are taking diabetes medicines or blood thinners.  Taking medicines such as aspirin and ibuprofen. These  medicines can thin your blood. Do not take these medicines before your procedure if your health care provider instructs you not to.  Tests and exams  You will have a physical exam.  You may have blood tests done to show: ? How well your kidneys and liver are working. ? How well your blood can clot.  General instructions  Plan to have someone take you home from the hospital or clinic.  If you will be going home right after the procedure, plan to have someone with you for 24 hours.  What happens during the procedure?  Your blood pressure, heart rate, breathing, level of pain and overall condition will be monitored.  An IV tube will be inserted into one of your veins.  Your anesthesia specialist will give you medicines as needed to keep you comfortable during the procedure. This may mean changing the level of sedation.  The procedure will be performed. After the procedure  Your blood pressure, heart rate, breathing rate, and blood oxygen level will be monitored until the medicines you were given have worn off.  Do not drive for 24 hours if you received a sedative.  You may: ? Feel sleepy, clumsy, or nauseous. ? Feel forgetful about what happened after the procedure. ? Have a sore throat if you had a breathing tube during the procedure. ? Vomit. This information is not intended to replace advice given to you by your health care provider. Make sure you discuss any questions you have with your health care provider. Document Released: 11/05/2004 Document Revised: 07/19/2015 Document Reviewed: 06/02/2015 Elsevier Interactive Patient Education  2018 [employer] Monitored Anesthesia Care, Care After These instructions provide you with information about caring for yourself after your procedure. Your health care provider may also give you more specific instructions. Your treatment has been planned according to current medical practices, but problems sometimes occur. Call your health  care provider if you have any problems or questions after your procedure. What can I expect after the procedure? After your procedure, it is common to:  Feel sleepy for several hours.  Feel clumsy and have poor balance for several hours.  Feel forgetful about what happened after the procedure.  Have poor judgment for several hours.  Feel nauseous or vomit.  Have a sore throat if you had a breathing tube during the procedure.  Follow these instructions at home: For at least 24 hours after the procedure:   Do not: ? Participate in activities in which you could fall or become injured. ? Drive. ? Use heavy machinery. ? Drink alcohol. ? Take sleeping pills or medicines that cause drowsiness. ? Make important decisions or sign legal documents. ? Take care of children on your own.  Rest. Eating and drinking  Follow the diet that is recommended by your health care provider.  If you vomit, drink water, juice, or soup when you can drink without vomiting.  Make sure you have little or no nausea before eating solid foods. General instructions  Have a responsible adult stay with you  until you are awake and alert.  Take over-the-counter and prescription medicines only as told by your health care provider.  If you smoke, do not smoke without supervision.  Keep all follow-up visits as told by your health care provider. This is important. Contact a health care provider if:  You keep feeling nauseous or you keep vomiting.  You feel light-headed.  You develop a rash.  You have a fever. Get help right away if:  You have trouble breathing. This information is not intended to replace advice given to you by your health care provider. Make sure you discuss any questions you have with your health care provider. Document Released: 06/02/2015 Document Revised: 10/02/2015 Document Reviewed: 06/02/2015 Elsevier Interactive Patient Education  [employer].

## 2017-01-28 ENCOUNTER — Other Ambulatory Visit: Payer: Self-pay

## 2017-01-28 ENCOUNTER — Encounter (HOSPITAL_COMMUNITY)
Admission: RE | Admit: 2017-01-28 | Discharge: 2017-01-28 | Disposition: A | Discharge status: Home / Self Care | Payer: Federal, State, Local not specified - PPO | Source: Ambulatory Visit | Attending: Gastroenterology | Admitting: Gastroenterology

## 2017-01-28 ENCOUNTER — Telehealth: Payer: Self-pay

## 2017-01-28 ENCOUNTER — Encounter (HOSPITAL_COMMUNITY): Payer: Self-pay

## 2017-01-28 DIAGNOSIS — R718 Other abnormality of red blood cells: Secondary | ICD-10-CM | POA: Diagnosis not present

## 2017-01-28 DIAGNOSIS — G4730 Sleep apnea, unspecified: Secondary | ICD-10-CM | POA: Diagnosis not present

## 2017-01-28 DIAGNOSIS — R634 Abnormal weight loss: Secondary | ICD-10-CM | POA: Diagnosis not present

## 2017-01-28 DIAGNOSIS — R9431 Abnormal electrocardiogram [ECG] [EKG]: Secondary | ICD-10-CM | POA: Diagnosis not present

## 2017-01-28 DIAGNOSIS — F17210 Nicotine dependence, cigarettes, uncomplicated: Secondary | ICD-10-CM | POA: Diagnosis not present

## 2017-01-28 DIAGNOSIS — E876 Hypokalemia: Secondary | ICD-10-CM | POA: Diagnosis not present

## 2017-01-28 DIAGNOSIS — R799 Abnormal finding of blood chemistry, unspecified: Secondary | ICD-10-CM | POA: Diagnosis present

## 2017-01-28 DIAGNOSIS — R109 Unspecified abdominal pain: Secondary | ICD-10-CM | POA: Diagnosis not present

## 2017-01-28 DIAGNOSIS — I452 Bifascicular block: Secondary | ICD-10-CM | POA: Diagnosis not present

## 2017-01-28 DIAGNOSIS — D7589 Other specified diseases of blood and blood-forming organs: Secondary | ICD-10-CM

## 2017-01-28 DIAGNOSIS — N179 Acute kidney failure, unspecified: Secondary | ICD-10-CM | POA: Diagnosis not present

## 2017-01-28 DIAGNOSIS — R339 Retention of urine, unspecified: Secondary | ICD-10-CM | POA: Diagnosis not present

## 2017-01-28 DIAGNOSIS — Z79899 Other long term (current) drug therapy: Secondary | ICD-10-CM | POA: Diagnosis not present

## 2017-01-28 DIAGNOSIS — E538 Deficiency of other specified B group vitamins: Secondary | ICD-10-CM | POA: Diagnosis not present

## 2017-01-28 HISTORY — DX: Headache: R51

## 2017-01-28 HISTORY — DX: Headache, unspecified: R51.9

## 2017-01-28 HISTORY — DX: Sleep apnea, unspecified: G47.30

## 2017-01-28 HISTORY — DX: Gastro-esophageal reflux disease without esophagitis: K21.9

## 2017-01-28 LAB — VITAMIN B12: VITAMIN B 12: 313 pg/mL (ref 180–914)

## 2017-01-28 LAB — COMPREHENSIVE METABOLIC PANEL
ALT: 12 U/L — ABNORMAL LOW (ref 17–63)
ANION GAP: 11 (ref 5–15)
AST: 17 U/L (ref 15–41)
Albumin: 4 g/dL (ref 3.5–5.0)
Alkaline Phosphatase: 88 U/L (ref 38–126)
BUN: 15 mg/dL (ref 6–20)
CHLORIDE: 98 mmol/L — AB (ref 101–111)
CO2: 32 mmol/L (ref 22–32)
CREATININE: 1.34 mg/dL — AB (ref 0.61–1.24)
Calcium: 9.2 mg/dL (ref 8.9–10.3)
GFR, EST NON AFRICAN AMERICAN: 58 mL/min — AB (ref >60)
Glucose, Bld: 76 mg/dL (ref 65–99)
POTASSIUM: 2.5 mmol/L — AB (ref 3.5–5.1)
Sodium: 141 mmol/L (ref 135–145)
Total Bilirubin: 0.7 mg/dL (ref 0.3–1.2)
Total Protein: 7.6 g/dL (ref 6.5–8.1)

## 2017-01-28 LAB — CBC WITH DIFFERENTIAL/PLATELET
Basophils Absolute: 0 10*3/uL (ref 0.0–0.1)
Basophils Relative: 0 %
EOS PCT: 2 %
Eosinophils Absolute: 0.2 10*3/uL (ref 0.0–0.7)
HCT: 49.9 % (ref 39.0–52.0)
Hemoglobin: 16.1 g/dL (ref 13.0–17.0)
LYMPHS ABS: 2.9 10*3/uL (ref 0.7–4.0)
LYMPHS PCT: 30 %
MCH: 34.8 pg — AB (ref 26.0–34.0)
MCHC: 32.3 g/dL (ref 30.0–36.0)
MCV: 107.8 fL — AB (ref 78.0–100.0)
MONO ABS: 1 10*3/uL (ref 0.1–1.0)
Monocytes Relative: 10 %
Neutro Abs: 5.6 10*3/uL (ref 1.7–7.7)
Neutrophils Relative %: 58 %
PLATELETS: 244 10*3/uL (ref 150–400)
RBC: 4.63 MIL/uL (ref 4.22–5.81)
RDW: 14.4 % (ref 11.5–15.5)
WBC: 9.7 10*3/uL (ref 4.0–10.5)

## 2017-01-28 LAB — FOLATE: FOLATE: 3.9 ng/mL — AB (ref >5.9)

## 2017-01-28 NOTE — Telephone Encounter (Signed)
REVIEWED-NO ADDITIONAL RECOMMENDATIONS. 

## 2017-01-28 NOTE — Telephone Encounter (Signed)
Endo nurse called office. Pt had abnormal EKG. He is scheduled for TCS/EGD/DIL w/Propofol w/SLF 02/02/17. She said pt will be seeing Dr. Purvis SheffieldKoneswaran tomorrow. She advised not to cancel pt's procedure at this time.

## 2017-01-28 NOTE — Telephone Encounter (Signed)
[facility] pt is scheduled for procedures on 02/02/17 with Dr. Darrick PennaFields and has a critical potassium of 2.5.

## 2017-01-28 NOTE — Pre-Procedure Instructions (Signed)
Potassium of 2.5 called to Dr Evelina DunField's office.they will send message to medical staff.

## 2017-01-28 NOTE — Telephone Encounter (Addendum)
Noted routed to me after I had left the office. Critical value, potassium 2.5. Called patient and notified of need to go to ER for Potassium tonight. Keep appointment with cardiology tomorrow.

## 2017-01-28 NOTE — Pre-Procedure Instructions (Signed)
Patient in for PAT.EKG shows bifacicular block. Patient on no meds, has had no symptoms and has never seem cardiology. EKG show to Dr Tomasa BlaseSchultz and he thinks patient needs cardiac clearance prior to procedure. Patient scheduled to see Dr Darl HouseholderKoneswaren 01/29/2017 at 0915. Dr Evelina DunField's office notified.

## 2017-01-29 ENCOUNTER — Observation Stay (HOSPITAL_COMMUNITY): Payer: Federal, State, Local not specified - PPO

## 2017-01-29 ENCOUNTER — Other Ambulatory Visit: Payer: Self-pay

## 2017-01-29 ENCOUNTER — Encounter (HOSPITAL_COMMUNITY): Payer: Self-pay | Admitting: *Deleted

## 2017-01-29 ENCOUNTER — Ambulatory Visit: Payer: Federal, State, Local not specified - PPO | Admitting: Cardiovascular Disease

## 2017-01-29 ENCOUNTER — Emergency Department (HOSPITAL_COMMUNITY): Payer: Federal, State, Local not specified - PPO

## 2017-01-29 ENCOUNTER — Observation Stay (HOSPITAL_COMMUNITY)
Admission: EM | Admit: 2017-01-29 | Discharge: 2017-01-29 | Disposition: A | Discharge status: Home / Self Care | Payer: Federal, State, Local not specified - PPO | Attending: Family Medicine | Admitting: Family Medicine

## 2017-01-29 DIAGNOSIS — R9431 Abnormal electrocardiogram [ECG] [EKG]: Secondary | ICD-10-CM | POA: Insufficient documentation

## 2017-01-29 DIAGNOSIS — I452 Bifascicular block: Secondary | ICD-10-CM | POA: Insufficient documentation

## 2017-01-29 DIAGNOSIS — R338 Other retention of urine: Secondary | ICD-10-CM | POA: Diagnosis present

## 2017-01-29 DIAGNOSIS — E876 Hypokalemia: Secondary | ICD-10-CM

## 2017-01-29 DIAGNOSIS — R109 Unspecified abdominal pain: Secondary | ICD-10-CM | POA: Insufficient documentation

## 2017-01-29 DIAGNOSIS — R718 Other abnormality of red blood cells: Secondary | ICD-10-CM | POA: Insufficient documentation

## 2017-01-29 DIAGNOSIS — R634 Abnormal weight loss: Secondary | ICD-10-CM | POA: Insufficient documentation

## 2017-01-29 DIAGNOSIS — E538 Deficiency of other specified B group vitamins: Secondary | ICD-10-CM

## 2017-01-29 DIAGNOSIS — N179 Acute kidney failure, unspecified: Secondary | ICD-10-CM | POA: Diagnosis not present

## 2017-01-29 DIAGNOSIS — R339 Retention of urine, unspecified: Secondary | ICD-10-CM | POA: Insufficient documentation

## 2017-01-29 DIAGNOSIS — F17210 Nicotine dependence, cigarettes, uncomplicated: Secondary | ICD-10-CM | POA: Insufficient documentation

## 2017-01-29 DIAGNOSIS — G4730 Sleep apnea, unspecified: Secondary | ICD-10-CM | POA: Diagnosis present

## 2017-01-29 DIAGNOSIS — Z79899 Other long term (current) drug therapy: Secondary | ICD-10-CM | POA: Insufficient documentation

## 2017-01-29 DIAGNOSIS — E860 Dehydration: Secondary | ICD-10-CM

## 2017-01-29 LAB — BASIC METABOLIC PANEL
Anion gap: 7 (ref 5–15)
BUN: 15 mg/dL (ref 6–20)
CALCIUM: 9 mg/dL (ref 8.9–10.3)
CHLORIDE: 100 mmol/L — AB (ref 101–111)
CO2: 31 mmol/L (ref 22–32)
CREATININE: 1.69 mg/dL — AB (ref 0.61–1.24)
GFR calc non Af Amer: 44 mL/min — ABNORMAL LOW (ref >60)
GFR, EST AFRICAN AMERICAN: 51 mL/min — AB (ref >60)
Glucose, Bld: 113 mg/dL — ABNORMAL HIGH (ref 65–99)
Potassium: 3.6 mmol/L (ref 3.5–5.1)
SODIUM: 138 mmol/L (ref 135–145)

## 2017-01-29 LAB — CBC WITH DIFFERENTIAL/PLATELET
BASOS ABS: 0 10*3/uL (ref 0.0–0.1)
Basophils Relative: 0 %
EOS ABS: 0.2 10*3/uL (ref 0.0–0.7)
EOS PCT: 1 %
HCT: 48.3 % (ref 39.0–52.0)
Hemoglobin: 15.6 g/dL (ref 13.0–17.0)
Lymphocytes Relative: 23 %
Lymphs Abs: 3.2 10*3/uL (ref 0.7–4.0)
MCH: 35.1 pg — ABNORMAL HIGH (ref 26.0–34.0)
MCHC: 32.3 g/dL (ref 30.0–36.0)
MCV: 108.8 fL — ABNORMAL HIGH (ref 78.0–100.0)
Monocytes Absolute: 1.2 10*3/uL — ABNORMAL HIGH (ref 0.1–1.0)
Monocytes Relative: 8 %
Neutro Abs: 9.6 10*3/uL — ABNORMAL HIGH (ref 1.7–7.7)
Neutrophils Relative %: 68 %
PLATELETS: 225 10*3/uL (ref 150–400)
RBC: 4.44 MIL/uL (ref 4.22–5.81)
RDW: 14.6 % (ref 11.5–15.5)
WBC: 14.1 10*3/uL — AB (ref 4.0–10.5)

## 2017-01-29 LAB — COMPREHENSIVE METABOLIC PANEL
ALT: 13 U/L — AB (ref 17–63)
AST: 23 U/L (ref 15–41)
Albumin: 4.1 g/dL (ref 3.5–5.0)
Alkaline Phosphatase: 94 U/L (ref 38–126)
Anion gap: 11 (ref 5–15)
BUN: 19 mg/dL (ref 6–20)
CHLORIDE: 95 mmol/L — AB (ref 101–111)
CO2: 30 mmol/L (ref 22–32)
CREATININE: 2.3 mg/dL — AB (ref 0.61–1.24)
Calcium: 9.1 mg/dL (ref 8.9–10.3)
GFR calc non Af Amer: 30 mL/min — ABNORMAL LOW (ref >60)
GFR, EST AFRICAN AMERICAN: 35 mL/min — AB (ref >60)
Glucose, Bld: 127 mg/dL — ABNORMAL HIGH (ref 65–99)
POTASSIUM: 2.5 mmol/L — AB (ref 3.5–5.1)
SODIUM: 136 mmol/L (ref 135–145)
Total Bilirubin: 0.7 mg/dL (ref 0.3–1.2)
Total Protein: 7.4 g/dL (ref 6.5–8.1)

## 2017-01-29 LAB — URINALYSIS, ROUTINE W REFLEX MICROSCOPIC
BILIRUBIN URINE: NEGATIVE
GLUCOSE, UA: NEGATIVE mg/dL
HGB URINE DIPSTICK: NEGATIVE
Ketones, ur: NEGATIVE mg/dL
LEUKOCYTES UA: NEGATIVE
NITRITE: NEGATIVE
Protein, ur: 30 mg/dL — AB
SPECIFIC GRAVITY, URINE: 1.012 (ref 1.005–1.030)
SQUAMOUS EPITHELIAL / LPF: NONE SEEN
pH: 6 (ref 5.0–8.0)

## 2017-01-29 LAB — MAGNESIUM: Magnesium: 2.8 mg/dL — ABNORMAL HIGH (ref 1.7–2.4)

## 2017-01-29 LAB — PHOSPHORUS: PHOSPHORUS: 3.9 mg/dL (ref 2.5–4.6)

## 2017-01-29 MED ORDER — POTASSIUM CHLORIDE 10 MEQ/100ML IV SOLN
INTRAVENOUS | Status: AC
  Filled 2017-01-29: qty 100

## 2017-01-29 MED ORDER — SODIUM CHLORIDE 0.9 % IV BOLUS (SEPSIS)
500.0000 mL | Freq: Once | INTRAVENOUS | Status: AC
  Administered 2017-01-29: 500 mL via INTRAVENOUS

## 2017-01-29 MED ORDER — POLYETHYLENE GLYCOL 3350 17 G PO PACK: 17.0000 g | PACK | Freq: Every day | ORAL | Status: DC | PRN

## 2017-01-29 MED ORDER — SODIUM CHLORIDE 0.9 % IV BOLUS (SEPSIS)
1000.0000 mL | Freq: Once | INTRAVENOUS | Status: AC
  Administered 2017-01-29: 1000 mL via INTRAVENOUS

## 2017-01-29 MED ORDER — HYDROCODONE-ACETAMINOPHEN 5-325 MG PO TABS: 1.0000 | ORAL_TABLET | Freq: Four times a day (QID) | ORAL | Status: DC | PRN

## 2017-01-29 MED ORDER — SUCRALFATE 1 G PO TABS: 1.0000 g | ORAL_TABLET | Freq: Three times a day (TID) | ORAL | Status: DC

## 2017-01-29 MED ORDER — ENOXAPARIN SODIUM 40 MG/0.4ML ~~LOC~~ SOLN
40.0000 mg | SUBCUTANEOUS | Status: DC
  Filled 2017-01-29: qty 0.4

## 2017-01-29 MED ORDER — POTASSIUM CHLORIDE 10 MEQ/100ML IV SOLN
10.0000 meq | INTRAVENOUS | Status: AC
  Administered 2017-01-29 (×3): 10 meq via INTRAVENOUS
  Filled 2017-01-29 (×2): qty 100

## 2017-01-29 MED ORDER — POTASSIUM CHLORIDE CRYS ER 20 MEQ PO TBCR
40.0000 meq | EXTENDED_RELEASE_TABLET | Freq: Once | ORAL | Status: AC
  Administered 2017-01-29: 40 meq via ORAL

## 2017-01-29 MED ORDER — SODIUM CHLORIDE 0.9 % IV SOLN
INTRAVENOUS | Status: DC
  Administered 2017-01-29: 06:00:00 via INTRAVENOUS

## 2017-01-29 MED ORDER — POTASSIUM CHLORIDE CRYS ER 20 MEQ PO TBCR
EXTENDED_RELEASE_TABLET | ORAL | Status: AC
  Filled 2017-01-29: qty 2

## 2017-01-29 MED ORDER — GABAPENTIN 300 MG PO CAPS: 300.0000 mg | ORAL_CAPSULE | Freq: Three times a day (TID) | ORAL | Status: DC | PRN

## 2017-01-29 MED ORDER — TAMSULOSIN HCL 0.4 MG PO CAPS
0.4000 mg | ORAL_CAPSULE | Freq: Every day | ORAL | Status: DC
  Administered 2017-01-29: 0.4 mg via ORAL
  Filled 2017-01-29 (×2): qty 1

## 2017-01-29 MED ORDER — POTASSIUM CHLORIDE IN NACL 20-0.9 MEQ/L-% IV SOLN: INTRAVENOUS | Status: DC

## 2017-01-29 MED ORDER — ACETAMINOPHEN 325 MG PO TABS: 650.0000 mg | ORAL_TABLET | Freq: Four times a day (QID) | ORAL | Status: DC | PRN

## 2017-01-29 MED ORDER — LINACLOTIDE 145 MCG PO CAPS: 290.0000 ug | ORAL_CAPSULE | Freq: Every day | ORAL | Status: DC

## 2017-01-29 MED ORDER — INFLUENZA VAC SPLIT QUAD 0.5 ML IM SUSY: 0.5000 mL | PREFILLED_SYRINGE | INTRAMUSCULAR | Status: DC

## 2017-01-29 MED ORDER — PANTOPRAZOLE SODIUM 40 MG PO TBEC: 40.0000 mg | DELAYED_RELEASE_TABLET | Freq: Two times a day (BID) | ORAL | Status: DC

## 2017-01-29 MED ORDER — POTASSIUM CHLORIDE CRYS ER 20 MEQ PO TBCR
20.0000 meq | EXTENDED_RELEASE_TABLET | Freq: Once | ORAL | Status: AC
  Administered 2017-01-29: 20 meq via ORAL
  Filled 2017-01-29: qty 1

## 2017-01-29 MED ORDER — FOLIC ACID 1 MG PO TABS
1.0000 mg | ORAL_TABLET | Freq: Once | ORAL | Status: AC
  Administered 2017-01-29: 1 mg via ORAL
  Filled 2017-01-29: qty 1

## 2017-01-29 NOTE — Progress Notes (Signed)
01/29/2017 12:21 AM  01/29/2017 10:03 AM  Omar Bailey was seen and examined.  The H&P by the admitting provider, orders, imaging was reviewed.  I added a lot of additional orders.  Please see new orders.  Will continue to follow.  Hopefully can discharge tomorrow if renal function starting to improve.  I think this is a prerenal AKI from dehydration as he has been having diarrhea on linzess.  Will hold the linzess until he follows up with GI.    Omar Bailey. Omar Koval, MD [facility]

## 2017-01-29 NOTE — H&P (Signed)
[facility] History and Physical  Omar Bailey ZOX:[id number] DOB: October 18, 1960 DOA: 01/29/2017  Referring physician:   PCP: Encarnacion SlatesBoyd, Amy H, PA-C   Chief Complaint: "My doctor's office called me and said I had abnormal labs."  HPI: Omar Brodony Ramnath is a 56 y.o. male with past medical history significant for untreated sleep apnea, ulnar neuropathy, reflux and back pain presents emergency room with chief complaint of abnormal labs.  Patient states that he has chronic constipation and is on Linzess.  Has diarrhea about twice a week.  No history of kidney issues.  Was called by his doctor to come to the emergency room for evaluation due to lab abnormalities.  Patient denies any abuse of NSAIDs or salicylates.  No drug use.  Drinks socially.  No history of kidney injury or stones.  Distant history of kidney cyst.  Denies urinary frequency, dysuria, hematuria, states that his stream is weaker than it used to be with comes urination.  Denies any nighttime awakenings.  Patiently only sleeps to hours a night daily.  ED course: Due to rapidly increasing creatinine EDP requested admission.  Hospitalist admitted patient.   Review of Systems:  As per HPI otherwise 10 point review of systems negative.    Past Medical History:  Diagnosis Date  . Chronic back pain   . Chronic pain    right shoulder, elbow, wrist  . GERD (gastroesophageal reflux disease)   . Headache   . Sleep apnea    stopped CPAP on own; sts' "it cleared up".  . Ulnar neuropathy of right upper extremity 05/13/2015   Past Surgical History:  Procedure Laterality Date  . BACK SURGERY     Social History:  reports that he has been smoking cigarettes.  He has a 30.00 pack-year smoking history. he has never used smokeless tobacco. He reports that he drinks alcohol. He reports that he does not use drugs.  Allergies  Allergen Reactions  . Sulfonamide Derivatives Swelling and Rash    Lip swelling     Family History  Problem  Relation Age of Onset  . Colon cancer Neg Hx   . Ulcers Neg Hx      Prior to Admission medications   Medication Sig Start Date End Date Taking? Authorizing Provider  gabapentin (NEURONTIN) 300 MG capsule Take 600 capsules by mouth 4 (four) times daily. Takes 5-6 per day 11/10/16  Yes [provider]  HYDROcodone-acetaminophen (NORCO/VICODIN) 5-325 MG tablet Take 1 tablet every 6 (six) hours as needed by mouth for moderate pain.   Yes [provider]  linaclotide Karlene Einstein(LINZESS) 290 MCG CAPS capsule Take 1 capsule (290 mcg total) by mouth daily before breakfast. 01/17/17  Yes Tiffany KocherLewis, Leslie S, PA-C  pantoprazole (PROTONIX) 40 MG tablet Take 1 tablet (40 mg total) 2 (two) times daily before a meal by mouth. 01/06/17  Yes Tiffany KocherLewis, Leslie S, PA-C  sucralfate (CARAFATE) 1 g tablet Take 1 g by mouth 3 (three) times daily. Crushed up   Yes [provider]  SUMAtriptan (IMITREX) 6 MG/0.5ML SOLN injection Inject 6 mg as needed into the skin for migraine or headache. May repeat in 2 hours if headache persists or recurs.   Yes [provider]   Physical Exam: Vitals:   01/29/17 0130 01/29/17 0200 01/29/17 0230 01/29/17 0410  BP: 118/83 118/75 110/67 134/76  Pulse: 74 75 74 82  Resp: 14 13 14    Temp:    97.7 F (36.5 C)  TempSrc:    Oral  SpO2: 100%  99% 98% 100%  Weight:      Height:    6' (1.829 m)    Wt Readings from Last 3 Encounters:  01/29/17 100.7 kg (222 lb)  01/28/17 100.7 kg (222 lb)  01/06/17 99.2 kg (218 lb 12.8 oz)    General:  Appears calm and comfortable; A&Ox3; appears very tired Eyes:  PERRL, EOMI, normal lids, iris ENT:  grossly normal hearing, lips & tongue Neck:  no LAD, masses or thyromegaly Cardiovascular:  RRR, no m/r/g. No LE edema.  Respiratory:  CTA bilaterally, no w/r/r. Normal respiratory effort. Abdomen:  soft, ntnd Skin:  no rash or induration seen on limited exam Musculoskeletal:  grossly normal tone BUE/BLE Psychiatric:  grossly  normal mood and affect, speech fluent and appropriate Neurologic:  CN 2-12 grossly intact, moves all extremities in coordinated fashion.          Labs on Admission:  Basic Metabolic Panel: Recent Labs  Lab 01/28/17 1230 01/29/17 0041  NA 141 136  K 2.5* 2.5*  CL 98* 95*  CO2 32 30  GLUCOSE 76 127*  BUN 15 19  CREATININE 1.34* 2.30*  CALCIUM 9.2 9.1  MG  --  2.8*  PHOS  --  3.9   Liver Function Tests: Recent Labs  Lab 01/28/17 1230 01/29/17 0041  AST 17 23  ALT 12* 13*  ALKPHOS 88 94  BILITOT 0.7 0.7  PROT 7.6 7.4  ALBUMIN 4.0 4.1   No results for input(s): LIPASE, AMYLASE in the last 168 hours. No results for input(s): AMMONIA in the last 168 hours. CBC: Recent Labs  Lab 01/28/17 1230 01/29/17 0041  WBC 9.7 14.1*  NEUTROABS 5.6 9.6*  HGB 16.1 15.6  HCT 49.9 48.3  MCV 107.8* 108.8*  PLT 244 225   Cardiac Enzymes: No results for input(s): CKTOTAL, CKMB, CKMBINDEX, TROPONINI in the last 168 hours.  BNP (last 3 results) No results for input(s): BNP in the last 8760 hours.  ProBNP (last 3 results) No results for input(s): PROBNP in the last 8760 hours.   Serum creatinine: 2.3 mg/dL (H) 19/14/78 2956 Estimated creatinine clearance: 44.6 mL/min (A)  CBG: No results for input(s): GLUCAP in the last 168 hours.  Radiological Exams on Admission: Ct Renal Stone Study  Result Date: 01/29/2017 CLINICAL DATA:  56 year old male with renal failure and weight loss over the past few months. EXAM: CT ABDOMEN AND PELVIS WITHOUT CONTRAST TECHNIQUE: Multidetector CT imaging of the abdomen and pelvis was performed following the standard protocol without IV contrast. COMPARISON:  Abdominal CT dated 12/28/2016 FINDINGS: Evaluation of this exam is limited in the absence of intravenous contrast. Lower chest: There is eventration of the left hemidiaphragm. Minimal bibasilar atelectatic changes. No intra-abdominal free air or free fluid. Hepatobiliary: The liver appears  unremarkable. There is tumefactive sludge versus noncalcified stone in the gallbladder (series 9, image 38 and coronal series 8, image 27). No pericholecystic fluid. Pancreas: Subcentimeter hypodense lesion in the uncinate process of the pancreas is not well characterized but may represent a side branch IPMN. No inflammatory changes of the pancreas or dilatation of the main pancreatic duct for Spleen: Normal in size without focal abnormality. Adrenals/Urinary Tract: The adrenal glands are unremarkable. There is a 7 mm nonobstructing right renal inferior pole stone. No hydronephrosis. A 1 cm left renal cyst. There is no hydronephrosis or nephrolithiasis on the left. The visualized ureters and urinary bladder appear unremarkable. Stomach/Bowel: Thickened appearance of the bladder wall, likely related to underdistention. Gastritis or underlying  infiltrative process are not excluded this finding is somewhat similar to prior CT studies. Clinical correlation and continued follow-up recommended. There is no bowel obstruction or active inflammation. Normal appendix. Vascular/Lymphatic: Mild aortoiliac atherosclerotic disease. The abdominal aorta and IVC are grossly unremarkable on this noncontrast CT. No portal venous gas. There is no adenopathy. Reproductive: The prostate and seminal vesicles are grossly unremarkable. Other: None Musculoskeletal: Degenerative changes at L5-S1. No acute osseous pathology. IMPRESSION: 1. A 7 mm nonobstructing right renal inferior pole stone. No hydronephrosis. 2. Gallbladder tumefactive sludge versus noncalcified stone. No pericholecystic fluid. 3. No bowel obstruction or active inflammation.  Normal appendix. 4.  Aortic Atherosclerosis (ICD10-I70.0). Electronically Signed   By: Elgie CollardArash  Radparvar M.D.   On: 01/29/2017 03:14    EKG: Independently reviewed.  Bifascicular block. No STEMI  Assessment/Plan Principal Problem:   AKI (acute kidney injury) (HCC) Active Problems:   Acute  urinary retention   Sleep apnea   AKI Baseline Cr nl range, Cr on admit 2.3 1500ml of normal saline given in the emergency room Gentle hydration overnight Checking magnesium and phosphorus Urine labs ordered to calculate fractional excretion of Na CT renal w/o IV cont w/o acute findings I&Os IVF  Urinary retention PVR in ED with 200+ml of urine after void Started on flomax  Sleep apnea Cont pulse ox OP f/u  Sleep deprivation Need sleep med referral or possibly psych  Bifascicular block Recheck EKG after electrolyte abnormalities corrected  Code Status: fc DVT Prophylaxis: heparin Family Communication: wife at bedside Disposition Plan: Pending Improvement  Status: obs tele  Haydee SalterPhillip M Hobbs, MD [facility] [email or web address] Password [email or web address]

## 2017-01-29 NOTE — ED Notes (Signed)
Date and time results received: 01/29/17 0142 (use smartphrase ".now" to insert current time)  Test: Potassium  Critical Value: 2.5  Name of Provider Notified: Lynelle DoctorKnapp  Orders Received? Or Actions Taken?:

## 2017-01-29 NOTE — Discharge Summary (Signed)
Physician Discharge Summary  Omar Brodony Hornaday [id number] DOB: February 02, 1961 DOA: 01/29/2017  PCP: Encarnacion SlatesBoyd, Amy H, PA-C  Admit date: 01/29/2017 Discharge date: 01/29/2017  Pt discharged against medical advice  Admitted From: Home  Disposition: Home   Recommendations for Outpatient Follow-up:  1. Follow up with PCP in 1 weeks 2. Please obtain BMP/CBC in one week  CODE STATUS: FULL    Brief Hospitalization Summary: Please see all hospital notes, images, labs for full details of the hospitalization. Chief Complaint: "My doctor's office called me and said I had abnormal labs."  HPI: Omar Bailey is a 56 y.o. male with past medical history significant for untreated sleep apnea, ulnar neuropathy, reflux and back pain presents emergency room with chief complaint of abnormal labs.  Patient states that he has chronic constipation and is on Linzess.  Has diarrhea about twice a week.  No history of kidney issues.  Was called by his doctor to come to the emergency room for evaluation due to lab abnormalities.  Patient denies any abuse of NSAIDs or salicylates.  No drug use.  Drinks socially.  No history of kidney injury or stones.  Distant history of kidney cyst.  Denies urinary frequency, dysuria, hematuria, states that his stream is weaker than it used to be with comes urination.  Denies any nighttime awakenings.  Patiently only sleeps to hours a night daily.  ED course: Due to rapidly increasing creatinine EDP requested admission.  Hospitalist admitted patient.  The patient was admitted for acute kidney injury and hypokalemia.  He was started on IV fluids and IV potassium replacement.  It was felt that his AKI was prerenal as he had been having diarrhea since starting linzess.  Unfortunately he and his wife decided that they would not stay for continued treatment and decided that they wanted to leave right now.  I was involved in a Code Stroke and was not able to come and immediately discharge him.  I  needed to see his follow up lab tests that  I had ordered earlier this morning.   Unfortunately they decided to leave AMA.  I strongly recommended that he follow up with his primary care physician and follow up on labs and recheck his potassium and renal function and to avoid nephrotoxic medications.    Discharge Diagnoses:  Principal Problem:   AKI (acute kidney injury) (HCC) Active Problems:   Acute urinary retention   Sleep apnea  Discharge Instructions:  Allergies as of 01/29/2017      Reactions   Sulfonamide Derivatives Swelling, Rash   Lip swelling       Allergies  Allergen Reactions  . Sulfonamide Derivatives Swelling and Rash    Lip swelling    Allergies as of 01/29/2017      Reactions   Sulfonamide Derivatives Swelling, Rash   Lip swelling      Procedures/Studies: [facility] Renal  Result Date: 01/29/2017 CLINICAL DATA:  Acute kidney injury. EXAM: RENAL / URINARY TRACT ULTRASOUND COMPLETE COMPARISON:  CT abdomen and pelvis 01/29/2017 FINDINGS: Right Kidney: Length: 11.4 cm. Echogenicity within normal limits. 7 mm lower pole stone as seen on CT. No mass or hydronephrosis visualized. Left Kidney: Length: 10.5 cm. Echogenicity within normal limits. 20 x 17 x 17 mm upper pole cyst. No solid mass or hydronephrosis visualized. Bladder: Appears normal for degree of bladder distention. IMPRESSION: 7 mm right renal calculus.  No hydronephrosis. Electronically Signed   By: Sebastian AcheAllen  Grady M.D.   On: 01/29/2017 10:44   Ct Renal Stone  Study  Result Date: 01/29/2017 CLINICAL DATA:  56 year old male with renal failure and weight loss over the past few months. EXAM: CT ABDOMEN AND PELVIS WITHOUT CONTRAST TECHNIQUE: Multidetector CT imaging of the abdomen and pelvis was performed following the standard protocol without IV contrast. COMPARISON:  Abdominal CT dated 12/28/2016 FINDINGS: Evaluation of this exam is limited in the absence of intravenous contrast. Lower chest: There is eventration of the  left hemidiaphragm. Minimal bibasilar atelectatic changes. No intra-abdominal free air or free fluid. Hepatobiliary: The liver appears unremarkable. There is tumefactive sludge versus noncalcified stone in the gallbladder (series 9, image 38 and coronal series 8, image 27). No pericholecystic fluid. Pancreas: Subcentimeter hypodense lesion in the uncinate process of the pancreas is not well characterized but may represent a side branch IPMN. No inflammatory changes of the pancreas or dilatation of the main pancreatic duct for Spleen: Normal in size without focal abnormality. Adrenals/Urinary Tract: The adrenal glands are unremarkable. There is a 7 mm nonobstructing right renal inferior pole stone. No hydronephrosis. A 1 cm left renal cyst. There is no hydronephrosis or nephrolithiasis on the left. The visualized ureters and urinary bladder appear unremarkable. Stomach/Bowel: Thickened appearance of the bladder wall, likely related to underdistention. Gastritis or underlying infiltrative process are not excluded this finding is somewhat similar to prior CT studies. Clinical correlation and continued follow-up recommended. There is no bowel obstruction or active inflammation. Normal appendix. Vascular/Lymphatic: Mild aortoiliac atherosclerotic disease. The abdominal aorta and IVC are grossly unremarkable on this noncontrast CT. No portal venous gas. There is no adenopathy. Reproductive: The prostate and seminal vesicles are grossly unremarkable. Other: None Musculoskeletal: Degenerative changes at L5-S1. No acute osseous pathology. IMPRESSION: 1. A 7 mm nonobstructing right renal inferior pole stone. No hydronephrosis. 2. Gallbladder tumefactive sludge versus noncalcified stone. No pericholecystic fluid. 3. No bowel obstruction or active inflammation.  Normal appendix. 4.  Aortic Atherosclerosis (ICD10-I70.0). Electronically Signed   By: Elgie CollardArash  Radparvar M.D.   On: 01/29/2017 03:14      Discharge Exam: Vitals:    01/29/17 0230 01/29/17 0410  BP: 110/67 134/76  Pulse: 74 82  Resp: 14   Temp:  97.7 F (36.5 C)  SpO2: 98% 100%   Vitals:   01/29/17 0200 01/29/17 0230 01/29/17 0410 01/29/17 0500  BP: 118/75 110/67 134/76   Pulse: 75 74 82   Resp: 13 14    Temp:   97.7 F (36.5 C)   TempSrc:   Oral   SpO2: 99% 98% 100%   Weight:    103.1 kg (227 lb 3.2 oz)  Height:   6' (1.829 m) 6' (1.829 m)   The results of significant diagnostics from this hospitalization (including imaging, microbiology, ancillary and laboratory) are listed below for reference.     Microbiology: No results found for this or any previous visit (from the past 240 hour(s)).   Labs: BNP (last 3 results) No results for input(s): BNP in the last 8760 hours. Basic Metabolic Panel: Recent Labs  Lab 01/28/17 1230 01/29/17 0041 01/29/17 1050  NA 141 136 138  K 2.5* 2.5* 3.6  CL 98* 95* 100*  CO2 32 30 31  GLUCOSE 76 127* 113*  BUN 15 19 15   CREATININE 1.34* 2.30* 1.69*  CALCIUM 9.2 9.1 9.0  MG  --  2.8*  --   PHOS  --  3.9  --    Liver Function Tests: Recent Labs  Lab 01/28/17 1230 01/29/17 0041  AST 17 23  ALT 12*  13*  ALKPHOS 88 94  BILITOT 0.7 0.7  PROT 7.6 7.4  ALBUMIN 4.0 4.1   No results for input(s): LIPASE, AMYLASE in the last 168 hours. No results for input(s): AMMONIA in the last 168 hours. CBC: Recent Labs  Lab 01/28/17 1230 01/29/17 0041  WBC 9.7 14.1*  NEUTROABS 5.6 9.6*  HGB 16.1 15.6  HCT 49.9 48.3  MCV 107.8* 108.8*  PLT 244 225   Cardiac Enzymes: No results for input(s): CKTOTAL, CKMB, CKMBINDEX, TROPONINI in the last 168 hours. BNP: Invalid input(s): POCBNP CBG: No results for input(s): GLUCAP in the last 168 hours. D-Dimer No results for input(s): DDIMER in the last 72 hours. Hgb A1c No results for input(s): HGBA1C in the last 72 hours. Lipid Profile No results for input(s): CHOL, HDL, LDLCALC, TRIG, CHOLHDL, LDLDIRECT in the last 72 hours. Thyroid function studies No  results for input(s): TSH, T4TOTAL, T3FREE, THYROIDAB in the last 72 hours.  Invalid input(s): FREET3 Anemia work up Recent Labs    01/28/17 1248  VITAMINB12 313  FOLATE 3.9*   Urinalysis    Component Value Date/Time   COLORURINE YELLOW 01/29/2017 0317   APPEARANCEUR CLEAR 01/29/2017 0317   LABSPEC 1.012 01/29/2017 0317   PHURINE 6.0 01/29/2017 0317   GLUCOSEU NEGATIVE 01/29/2017 0317   HGBUR NEGATIVE 01/29/2017 0317   BILIRUBINUR NEGATIVE 01/29/2017 0317   KETONESUR NEGATIVE 01/29/2017 0317   PROTEINUR 30 (A) 01/29/2017 0317   NITRITE NEGATIVE 01/29/2017 0317   LEUKOCYTESUR NEGATIVE 01/29/2017 0317   Sepsis Labs Invalid input(s): PROCALCITONIN,  WBC,  LACTICIDVEN Microbiology No results found for this or any previous visit (from the past 240 hour(s)).  Time coordinating discharge:   SIGNED:  Standley Dakins, MD  [facility] 01/29/2017, 2:31 PM Pager [phone]  If 7PM-7AM, please contact night-coverage [email or web address] Password [email or web address]

## 2017-01-29 NOTE — Progress Notes (Signed)
Patient leaving hospital against medical advice. MD notified. IV and telemetry removed. Pt and family educated on risks of leaving AMA and verbalized understanding. AMA in chart.  Dominic PeaSharon A Godric Lavell, RN

## 2017-01-29 NOTE — ED Provider Notes (Signed)
[facility]GENCY [facility]PARTMENT Provider Note   CSN: [id number] Arrival date & time: 01/29/17  0009  Time seen 12:50 AM   History   Chief Complaint Chief Complaint  Patient presents with  . Abnormal Lab    HPI Omar Bailey is a 56 y.o. male.  HPI patient states he has been tired and has not been able to sleep well for several months.  He states that it is all because of his abdominal problems he has been having.  He states his evaluation of his abdominal pains have been delayed because of bronchitis he had all summer.  He states he started Linzess about a week ago and now he is having diarrhea every other day about once a day although yesterday he had 2 episodes.  He denies leg cramping, nausea, or vomiting.  He denies being on fluid pills.  He states he only eats once a day which is been his habit for a long time.  Patient states he had preop laboratory testing done and was called that his potassium was low and he had a abnormal EKG.  He has an appointment in the morning at 920 with cardiology.  He states that he has had [facility].  He is unaware that his EKGs have been abnormal.  He was told to come to the ED for evaluation of his low potassium.  PCP Encarnacion SlatesBoyd, Amy H, PA-C GI Dr Darrick PennaFields  Past Medical History:  Diagnosis Date  . Chronic back pain   . Chronic pain    right shoulder, elbow, wrist  . GERD (gastroesophageal reflux disease)   . Headache   . Sleep apnea    stopped CPAP on own; sts' "it cleared up".  . 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 AKI (acute kidney injury) (HCC) 01/29/2017  . Constipation 01/06/2017  . Abdominal pain, epigastric 01/06/2017  . Abnormal weight loss 01/06/2017  . Abnormal CT scan, stomach 01/06/2017  . Rectal bleeding 01/06/2017  . Macrocytosis 01/06/2017  . Dilation of biliary tract 01/06/2017  . Abnormal CT scan, kidney 01/06/2017  . Ulnar  neuropathy of right upper extremity 05/13/2015  . Disorders of bursae and tendons in shoulder region, unspecified 10/07/2009    Past Surgical History:  Procedure Laterality Date  . BACK SURGERY         Home Medications    Prior to Admission medications   Medication Sig Start Date End Date Taking? Authorizing Provider  gabapentin (NEURONTIN) 300 MG capsule Take 600 capsules by mouth 4 (four) times daily. Takes 5-6 per day 11/10/16  Yes [provider]  HYDROcodone-acetaminophen (NORCO/VICODIN) 5-325 MG tablet Take 1 tablet every 6 (six) hours as needed by mouth for moderate pain.   Yes [provider]  linaclotide Karlene Einstein(LINZESS) 290 MCG CAPS capsule Take 1 capsule (290 mcg total) by mouth daily before breakfast. 01/17/17  Yes Tiffany KocherLewis, Leslie S, PA-C  pantoprazole (PROTONIX) 40 MG tablet Take 1 tablet (40 mg total) 2 (two) times daily before a meal by mouth. 01/06/17  Yes Tiffany KocherLewis, Leslie S, PA-C  sucralfate (CARAFATE) 1 g tablet Take 1 g by mouth 3 (three) times daily. Crushed up   Yes [provider]  SUMAtriptan (IMITREX) 6 MG/0.5ML SOLN injection Inject 6 mg as needed into the skin for migraine or headache. May repeat in 2 hours if headache persists or recurs.   Yes [provider]  Family History Family History  Problem Relation Age of Onset  . Colon cancer Neg Hx   . Ulcers Neg Hx     Social History Social History   Tobacco Use  . Smoking status: Current Every Day Smoker    Packs/day: 1.00    Years: 30.00    Pack years: 30.00    Types: Cigarettes  . Smokeless tobacco: Never Used  Substance Use Topics  . Alcohol use: Yes    Comment: occ beer  . Drug use: No  On disability for back problems and nerve problems in his upper extremity Lives with spouse   Allergies   Sulfonamide derivatives   Review of Systems Review of Systems  All other systems reviewed and are negative.    Physical Exam Updated Vital Signs BP 110/67   Pulse 74    Resp 14   Ht 6' (1.829 m)   Wt 100.7 kg (222 lb)   SpO2 98%   BMI 30.11 kg/m   Vital signs normal    Physical Exam  Constitutional: He is oriented to person, place, and time. He appears well-developed and well-nourished.  Non-toxic appearance. He does not appear ill. No distress.  HENT:  Head: Normocephalic and atraumatic.  Right Ear: External ear normal.  Left Ear: External ear normal.  Nose: Nose normal. No mucosal edema or rhinorrhea.  Mouth/Throat: Oropharynx is clear and moist and mucous membranes are normal. No dental abscesses or uvula swelling.  Eyes: Conjunctivae and EOM are normal. Pupils are equal, round, and reactive to light.  Neck: Normal range of motion and full passive range of motion without pain. Neck supple.  Cardiovascular: Normal rate, regular rhythm and normal heart sounds. Exam reveals no gallop and no friction rub.  No murmur heard. Pulmonary/Chest: Effort normal and breath sounds normal. No respiratory distress. He has no wheezes. He has no rhonchi. He has no rales. He exhibits no tenderness and no crepitus.  Abdominal: Soft. Normal appearance and bowel sounds are normal. He exhibits no distension. There is no tenderness. There is no rebound and no guarding.  Musculoskeletal: Normal range of motion. He exhibits no edema or tenderness.  Moves all extremities well.   Neurological: He is alert and oriented to person, place, and time. He has normal strength. No cranial nerve deficit.  Skin: Skin is warm, dry and intact. No rash noted. No erythema. No pallor.  Patient has dark circles around his eyes  Psychiatric: He has a normal mood and affect. His speech is normal and behavior is normal. His mood appears not anxious.  Nursing note and vitals reviewed.    ED Treatments / Results  Labs (all labs ordered are listed, but only abnormal results are displayed) Results for orders placed or performed during the hospital encounter of 01/29/17  CBC with  Differential  Result Value Ref Range   WBC 14.1 (H) 4.0 - 10.5 K/uL   RBC 4.44 4.22 - 5.81 MIL/uL   Hemoglobin 15.6 13.0 - 17.0 g/dL   HCT 21.3 08.6 - 57.8 %   MCV 108.8 (H) 78.0 - 100.0 fL   MCH 35.1 (H) 26.0 - 34.0 pg   MCHC 32.3 30.0 - 36.0 g/dL   RDW 46.9 62.9 - 52.8 %   Platelets 225 150 - 400 K/uL   Neutrophils Relative % 68 %   Neutro Abs 9.6 (H) 1.7 - 7.7 K/uL   Lymphocytes Relative 23 %   Lymphs Abs 3.2 0.7 - 4.0 K/uL   Monocytes Relative 8 %  Monocytes Absolute 1.2 (H) 0.1 - 1.0 K/uL   Eosinophils Relative 1 %   Eosinophils Absolute 0.2 0.0 - 0.7 K/uL   Basophils Relative 0 %   Basophils Absolute 0.0 0.0 - 0.1 K/uL  Comprehensive metabolic panel  Result Value Ref Range   Sodium 136 135 - 145 mmol/L   Potassium 2.5 (LL) 3.5 - 5.1 mmol/L   Chloride 95 (L) 101 - 111 mmol/L   CO2 30 22 - 32 mmol/L   Glucose, Bld 127 (H) 65 - 99 mg/dL   BUN 19 6 - 20 mg/dL   Creatinine, Ser 1.612.30 (H) 0.61 - 1.24 mg/dL   Calcium 9.1 8.9 - 09.610.3 mg/dL   Total Protein 7.4 6.5 - 8.1 g/dL   Albumin 4.1 3.5 - 5.0 g/dL   AST 23 15 - 41 U/L   ALT 13 (L) 17 - 63 U/L   Alkaline Phosphatase 94 38 - 126 U/L   Total Bilirubin 0.7 0.3 - 1.2 mg/dL   GFR calc non Af Amer 30 (L) >60 mL/min   GFR calc Af Amer 35 (L) >60 mL/min   Anion gap 11 5 - 15  Magnesium  Result Value Ref Range   Magnesium 2.8 (H) 1.7 - 2.4 mg/dL  Phosphorus  Result Value Ref Range   Phosphorus 3.9 2.5 - 4.6 mg/dL   Laboratory interpretation all normal except hypokalemia, worsening renal since yesterday states his creatinine was 1.34 yesterday), high magnesium, leukocytosis, elevated indices consistent with vitamin B12 or folic acid deficiency    EKG  EKG Interpretation  Date/Time:  Friday January 29 2017 00:30:25 EST Ventricular Rate:  82 PR Interval:    QRS Duration: 182 QT Interval:  459 QTC Calculation: 537 R Axis:   -91 Text Interpretation:  Sinus rhythm Probable left atrial enlargement RBBB and LAFB Left  ventricular hypertrophy No significant change since last tracing yesterday Confirmed by Devoria AlbeKnapp, Khalik Pewitt ([id number]) on 01/29/2017 12:57:12 AM       Radiology No results found.  Procedures .Critical Care Performed by: Devoria AlbeKnapp, Leatrice Parilla, MD Authorized by: Devoria AlbeKnapp, Earline Stiner, MD   Critical care provider statement:    Critical care time (minutes):  31   Critical care was necessary to treat or prevent imminent or life-threatening deterioration of the following conditions:  Cardiac failure and metabolic crisis   Critical care was time spent personally by me on the following activities:  Discussions with consultants, examination of patient, obtaining history from patient or surrogate, ordering and review of laboratory studies, re-evaluation of patient's condition and review of old charts   (including critical care time)  Medications Ordered in ED Medications  potassium chloride 10 mEq in 100 mL IVPB (10 mEq Intravenous New Bag/Given 01/29/17 0203)  sodium chloride 0.9 % bolus 500 mL (not administered)  potassium chloride SA (K-DUR,KLOR-CON) CR tablet 40 mEq (40 mEq Oral Given 01/29/17 0110)  sodium chloride 0.9 % bolus 1,000 mL (1,000 mLs Intravenous New Bag/Given 01/29/17 0202)  folic acid (FOLVITE) tablet 1 mg (1 mg Oral Given 01/29/17 0159)     Initial Impression / Assessment and Plan / ED Course  I have reviewed the triage vital signs and the nursing notes.  Pertinent labs & imaging results that were available during my care of the patient were reviewed by me and considered in my medical decision making (see chart for details).     Patient was started on oral potassium and IV potassium runs x3 after reviewing the blood work he had done yesterday.  Patient is  also noted to be folic acid deficient.  He was started on oral folic acid.  Lab work was also done [employer].  1:45 AM after reviewing his laboratory testing tonight he is noted to have acute worsening of his renal insufficiency he had on his blood work  only about 12 hours ago done in the same lab.  He was given IV fluids.  Hopefully this is a sign of dehydration.  2:29 AM Dr. Melynda Ripple, hospitalist, will admit.  Discussed with patient the need for admission due to his worsening kidney function.  He states he noted that he was very thirsty all day today.  Final Clinical Impressions(s) / ED Diagnoses   Final diagnoses:  Hypokalemia  Acute renal injury (HCC)  Abnormal EKG  Folic acid deficiency  Elevated MCV    Plan admission  Devoria Albe, MD, Concha Pyo, MD 01/29/17 [phone]

## 2017-01-29 NOTE — ED Triage Notes (Signed)
Pt had bllod work done today & was called to come to the ER due to low potassium

## 2017-02-03 ENCOUNTER — Telehealth: Payer: Self-pay

## 2017-02-03 NOTE — Telephone Encounter (Signed)
Tried to call pt to reschedule TCS/EGD/Dil w/propofol w/SLF that he was unable to have done yesterday d/t inclement weather. Lady answered mobile number and said she wasn't in the house. Asked her to have pt call office (no one listed on release of information form).

## 2017-02-05 NOTE — Telephone Encounter (Signed)
Tried to call pt, no answer, LMOAM for him to call office to reschedule procedure.

## 2017-02-08 NOTE — Telephone Encounter (Signed)
Letter mailed to pt.  

## 2017-02-09 ENCOUNTER — Encounter: Payer: Self-pay | Admitting: *Deleted

## 2017-02-09 NOTE — Telephone Encounter (Signed)
Patient called back. He is rescheduled for 03/23/17 at 7:30am. Patient did not mix prep. I have mailed patient new instructions. Aware he will also need to have another pre-op appt. I will call him back with this information.

## 2017-02-09 NOTE — Telephone Encounter (Signed)
LM w/ spouse to have pt call back. Pt pre-op scheduled for 03/18/17 at 11:00am. Letter mailed to pt.

## 2017-03-17 NOTE — Patient Instructions (Signed)
Omar Bailey  03/17/2017     @PREFPERIOPPHARMACY @   Your procedure is scheduled on  03/23/2017 .  Report to [facility] at  615   A.M.  Call this number if you have problems the morning of surgery:  [phone]   Remember:  Do not eat food or drink liquids after midnight.  Take these medicines the morning of surgery with A SIP OF WATER  Neurontin, hydrocodone, protonix.   Do not wear jewelry, make-up or nail polish.  Do not wear lotions, powders, or perfumes, or deodorant.  Do not shave 48 hours prior to surgery.  Men may shave face and neck.  Do not bring valuables to the hospital.  [facility] is not responsible for any belongings or valuables.  Contacts, dentures or bridgework may not be worn into surgery.  Leave your suitcase in the car.  After surgery it may be brought to your room.  For patients admitted to the hospital, discharge time will be determined by your treatment team.  Patients discharged the day of surgery will not be allowed to drive home.   Name and phone number of your driver:   family Special instructions:  Follow the diet and prep instructions given to you by Dr Evelina Dun office.  Please read over the following fact sheets that you were given. Anesthesia Post-op Instructions and Care and Recovery After Surgery       Esophagogastroduodenoscopy Esophagogastroduodenoscopy (EGD) is a procedure to examine the lining of the esophagus, stomach, and first part of the small intestine (duodenum). This procedure is done to check for problems such as inflammation, bleeding, ulcers, or growths. During this procedure, a long, flexible, lighted tube with a camera attached (endoscope) is inserted down the throat. Tell a health care provider about:  Any allergies you have.  All medicines you are taking, including vitamins, herbs, eye drops, creams, and over-the-counter medicines.  Any problems you or family members have had with  anesthetic medicines.  Any blood disorders you have.  Any surgeries you have had.  Any medical conditions you have.  Whether you are pregnant or may be pregnant. What are the risks? Generally, this is a safe procedure. However, problems may occur, including:  Infection.  Bleeding.  A tear (perforation) in the esophagus, stomach, or duodenum.  Trouble breathing.  Excessive sweating.  Spasms of the larynx.  A slowed heartbeat.  Low blood pressure.  What happens before the procedure?  Follow instructions from your health care provider about eating or drinking restrictions.  Ask your health care provider about: ? Changing or stopping your regular medicines. This is especially important if you are taking diabetes medicines or blood thinners. ? Taking medicines such as aspirin and ibuprofen. These medicines can thin your blood. Do not take these medicines before your procedure if your health care provider instructs you not to.  Plan to have someone take you home after the procedure.  If you wear dentures, be ready to remove them before the procedure. What happens during the procedure?  To reduce your risk of infection, your health care team will wash or sanitize their hands.  An IV tube will be put in a vein in your hand or arm. You will get medicines and fluids through this tube.  You will be given one or more of the following: ? A medicine to help you relax (sedative). ? A medicine to numb the area (  local anesthetic). This medicine may be sprayed into your throat. It will make you feel more comfortable and keep you from gagging or coughing during the procedure. ? A medicine for pain.  A mouth guard may be placed in your mouth to protect your teeth and to keep you from biting on the endoscope.  You will be asked to lie on your left side.  The endoscope will be lowered down your throat into your esophagus, stomach, and duodenum.  Air will be put into the endoscope.  This will help your health care provider see better.  The lining of your esophagus, stomach, and duodenum will be examined.  Your health care provider may: ? Take a tissue sample so it can be looked at in a lab (biopsy). ? Remove growths. ? Remove objects (foreign bodies) that are stuck. ? Treat any bleeding with medicines or other devices that stop tissue from bleeding. ? Widen (dilate) or stretch narrowed areas of your esophagus and stomach.  The endoscope will be taken out. The procedure may vary among health care providers and hospitals. What happens after the procedure?  Your blood pressure, heart rate, breathing rate, and blood oxygen level will be monitored often until the medicines you were given have worn off.  Do not eat or drink anything until the numbing medicine has worn off and your gag reflex has returned. This information is not intended to replace advice given to you by your health care provider. Make sure you discuss any questions you have with your health care provider. Document Released: 06/12/2004 Document Revised: 07/18/2015 Document Reviewed: 01/03/2015 Elsevier Interactive Patient Education  2018 [employer]. Esophagogastroduodenoscopy, Care After Refer to this sheet in the next few weeks. These instructions provide you with information about caring for yourself after your procedure. Your health care provider may also give you more specific instructions. Your treatment has been planned according to current medical practices, but problems sometimes occur. Call your health care provider if you have any problems or questions after your procedure. What can I expect after the procedure? After the procedure, it is common to have:  A sore throat.  Nausea.  Bloating.  Dizziness.  Fatigue.  Follow these instructions at home:  Do not eat or drink anything until the numbing medicine (local anesthetic) has worn off and your gag reflex has returned. You will know  that the local anesthetic has worn off when you can swallow comfortably.  Do not drive for 24 hours if you received a medicine to help you relax (sedative).  If your health care provider took a tissue sample for testing during the procedure, make sure to get your test results. This is your responsibility. Ask your health care provider or the department performing the test when your results will be ready.  Keep all follow-up visits as told by your health care provider. This is important. Contact a health care provider if:  You cannot stop coughing.  You are not urinating.  You are urinating less than usual. Get help right away if:  You have trouble swallowing.  You cannot eat or drink.  You have throat or chest pain that gets worse.  You are dizzy or light-headed.  You faint.  You have nausea or vomiting.  You have chills.  You have a fever.  You have severe abdominal pain.  You have black, tarry, or bloody stools. This information is not intended to replace advice given to you by your health care provider. Make sure you discuss  any questions you have with your health care provider. Document Released: 01/27/2012 Document Revised: 07/18/2015 Document Reviewed: 01/03/2015 Elsevier Interactive Patient Education  2018 [employer]  Esophageal Dilatation Esophageal dilatation is a procedure to open a blocked or narrowed part of the esophagus. The esophagus is the long tube in your throat that carries food and liquid from your mouth to your stomach. The procedure is also called esophageal dilation. You may need this procedure if you have a buildup of scar tissue in your esophagus that makes it difficult, painful, or even impossible to swallow. This can be caused by gastroesophageal reflux disease (GERD). In rare cases, people need this procedure because they have cancer of the esophagus or a problem with the way food moves through the esophagus. Sometimes you may need to have  another dilatation to enlarge the opening of the esophagus gradually. Tell a health care provider about:  Any allergies you have.  All medicines you are taking, including vitamins, herbs, eye drops, creams, and over-the-counter medicines.  Any problems you or family members have had with anesthetic medicines.  Any blood disorders you have.  Any surgeries you have had.  Any medical conditions you have.  Any antibiotic medicines you are required to take before dental procedures. What are the risks? Generally, this is a safe procedure. However, problems can occur and include:  Bleeding from a tear in the lining of the esophagus.  A hole (perforation) in the esophagus.  What happens before the procedure?  Do not eat or drink anything after midnight on the night before the procedure or as directed by your health care provider.  Ask your health care provider about changing or stopping your regular medicines. This is especially important if you are taking diabetes medicines or blood thinners.  Plan to have someone take you home after the procedure. What happens during the procedure?  You will be given a medicine that makes you relaxed and sleepy (sedative).  A medicine may be sprayed or gargled to numb the back of the throat.  Your health care provider can use various instruments to do an esophageal dilatation. During the procedure, the instrument used will be placed in your mouth and passed down into your esophagus. Options include: ? Simple dilators. This instrument is carefully placed in the esophagus to stretch it. ? Guided wire bougies. In this method, a flexible tube (endoscope) is used to insert a wire into the esophagus. The dilator is passed over this wire to enlarge the esophagus. Then the wire is removed. ? Balloon dilators. An endoscope with a small balloon at the end is passed down into the esophagus. Inflating the balloon gently stretches the esophagus and opens it  up. What happens after the procedure?  Your blood pressure, heart rate, breathing rate, and blood oxygen level will be monitored often until the medicines you were given have worn off.  Your throat may feel slightly sore and will probably still feel numb. This will improve slowly over time.  You will not be allowed to eat or drink until the throat numbness has resolved.  If this is a same-day procedure, you may be allowed to go home once you have been able to drink, urinate, and sit on the edge of the bed without nausea or dizziness.  If this is a same-day procedure, you should have a friend or family member with you for the next 24 hours after the procedure. This information is not intended to replace advice given to you by  your health care provider. Make sure you discuss any questions you have with your health care provider. Document Released: 04/02/2005 Document Revised: 07/18/2015 Document Reviewed: 06/21/2013 Elsevier Interactive Patient Education  [employer].  Colonoscopy, Adult A colonoscopy is an exam to look at the large intestine. It is done to check for problems, such as:  Lumps (tumors).  Growths (polyps).  Swelling (inflammation).  Bleeding.  What happens before the procedure? Eating and drinking Follow instructions from your doctor about eating and drinking. These instructions may include:  A few days before the procedure - follow a low-fiber diet. ? Avoid nuts. ? Avoid seeds. ? Avoid dried fruit. ? Avoid raw fruits. ? Avoid vegetables.  1-3 days before the procedure - follow a clear liquid diet. Avoid liquids that have red or purple dye. Drink only clear liquids, such as: ? Clear broth or bouillon. ? Black coffee or tea. ? Clear juice. ? Clear soft drinks or sports drinks. ? Gelatin dessert. ? Popsicles.  On the day of the procedure - do not eat or drink anything during the 2 hours before the procedure.  Bowel prep If you were prescribed an oral  bowel prep:  Take it as told by your doctor. Starting the day before your procedure, you will need to drink a lot of liquid. The liquid will cause you to poop (have bowel movements) until your poop is almost clear or light green.  If your skin or butt gets irritated from diarrhea, you may: ? Wipe the area with wipes that have medicine in them, such as adult wet wipes with aloe and vitamin E. ? Put something on your skin that soothes the area, such as petroleum jelly.  If you throw up (vomit) while drinking the bowel prep, take a break for up to 60 minutes. Then begin the bowel prep again. If you keep throwing up and you cannot take the bowel prep without throwing up, call your doctor.  General instructions  Ask your doctor about changing or stopping your normal medicines. This is important if you take diabetes medicines or blood thinners.  Plan to have someone take you home from the hospital or clinic. What happens during the procedure?  An IV tube may be put into one of your veins.  You will be given medicine to help you relax (sedative).  To reduce your risk of infection: ? Your doctors will wash their hands. ? Your anal area will be washed with soap.  You will be asked to lie on your side with your knees bent.  Your doctor will get a long, thin, flexible tube ready. The tube will have a camera and a light on the end.  The tube will be put into your anus.  The tube will be gently put into your large intestine.  Air will be delivered into your large intestine to keep it open. You may feel some pressure or cramping.  The camera will be used to take photos.  A small tissue sample may be removed from your body to be looked at under a microscope (biopsy). If any possible problems are found, the tissue will be sent to a lab for testing.  If small growths are found, your doctor may remove them and have them checked for cancer.  The tube that was put into your anus will be slowly  removed. The procedure may vary among doctors and hospitals. What happens after the procedure?  Your doctor will check on you often until the medicines  you were given have worn off.  Do not drive for 24 hours after the procedure.  You may have a small amount of blood in your poop.  You may pass gas.  You may have mild cramps or bloating in your belly (abdomen).  It is up to you to get the results of your procedure. Ask your doctor, or the department performing the procedure, when your results will be ready. This information is not intended to replace advice given to you by your health care provider. Make sure you discuss any questions you have with your health care provider. Document Released: 03/14/2010 Document Revised: 12/11/2015 Document Reviewed: 04/23/2015 Elsevier Interactive Patient Education  2017 [employer] Inc[employer]  Colonoscopy, Adult, Care After This sheet gives you information about how to care for yourself after your procedure. Your health care provider may also give you more specific instructions. If you have problems or questions, contact your health care provider. What can I expect after the procedure? After the procedure, it is common to have:  A small amount of blood in your stool for 24 hours after the procedure.  Some gas.  Mild abdominal cramping or bloating.  Follow these instructions at home: General instructions   For the first 24 hours after the procedure: ? Do not drive or use machinery. ? Do not sign important documents. ? Do not drink alcohol. ? Do your regular daily activities at a slower pace than normal. ? Eat soft, easy-to-digest foods. ? Rest often.  Take over-the-counter or prescription medicines only as told by your health care provider.  It is up to you to get the results of your procedure. Ask your health care provider, or the department performing the procedure, when your results will be ready. Relieving cramping and bloating  Try  walking around when you have cramps or feel bloated.  Apply heat to your abdomen as told by your health care provider. Use a heat source that your health care provider recommends, such as a moist heat pack or a heating pad. ? Place a towel between your skin and the heat source. ? Leave the heat on for 20-30 minutes. ? Remove the heat if your skin turns bright red. This is especially important if you are unable to feel pain, heat, or cold. You may have a greater risk of getting burned. Eating and drinking  Drink enough fluid to keep your urine clear or pale yellow.  Resume your normal diet as instructed by your health care provider. Avoid heavy or fried foods that are hard to digest.  Avoid drinking alcohol for as long as instructed by your health care provider. Contact a health care provider if:  You have blood in your stool 2-3 days after the procedure. Get help right away if:  You have more than a small spotting of blood in your stool.  You pass large blood clots in your stool.  Your abdomen is swollen.  You have nausea or vomiting.  You have a fever.  You have increasing abdominal pain that is not relieved with medicine. This information is not intended to replace advice given to you by your health care provider. Make sure you discuss any questions you have with your health care provider. Document Released: 09/24/2003 Document Revised: 11/04/2015 Document Reviewed: 04/23/2015 Elsevier Interactive Patient Education  2018 [employer] Anesthesia is a term that refers to techniques, procedures, and medicines that help a person stay safe and comfortable during a medical procedure. Monitored anesthesia care,  or sedation, is one type of anesthesia. Your anesthesia specialist may recommend sedation if you will be having a procedure that does not require you to be unconscious, such as:  Cataract surgery.  A dental procedure.  A biopsy.  A  colonoscopy.  During the procedure, you may receive a medicine to help you relax (sedative). There are three levels of sedation:  Mild sedation. At this level, you may feel awake and relaxed. You will be able to follow directions.  Moderate sedation. At this level, you will be sleepy. You may not remember the procedure.  Deep sedation. At this level, you will be asleep. You will not remember the procedure.  The more medicine you are given, the deeper your level of sedation will be. Depending on how you respond to the procedure, the anesthesia specialist may change your level of sedation or the type of anesthesia to fit your needs. An anesthesia specialist will monitor you closely during the procedure. Let your health care provider know about:  Any allergies you have.  All medicines you are taking, including vitamins, herbs, eye drops, creams, and over-the-counter medicines.  Any use of steroids (by mouth or as a cream).  Any problems you or family members have had with sedatives and anesthetic medicines.  Any blood disorders you have.  Any surgeries you have had.  Any medical conditions you have, such as sleep apnea.  Whether you are pregnant or may be pregnant.  Any use of cigarettes, alcohol, or street drugs. What are the risks? Generally, this is a safe procedure. However, problems may occur, including:  Getting too much medicine (oversedation).  Nausea.  Allergic reaction to medicines.  Trouble breathing. If this happens, a breathing tube may be used to help with breathing. It will be removed when you are awake and breathing on your own.  Heart trouble.  Lung trouble.  Before the procedure Staying hydrated Follow instructions from your health care provider about hydration, which may include:  Up to 2 hours before the procedure - you may continue to drink clear liquids, such as water, clear fruit juice, black coffee, and plain tea.  Eating and drinking  restrictions Follow instructions from your health care provider about eating and drinking, which may include:  8 hours before the procedure - stop eating heavy meals or foods such as meat, fried foods, or fatty foods.  6 hours before the procedure - stop eating light meals or foods, such as toast or cereal.  6 hours before the procedure - stop drinking milk or drinks that contain milk.  2 hours before the procedure - stop drinking clear liquids.  Medicines Ask your health care provider about:  Changing or stopping your regular medicines. This is especially important if you are taking diabetes medicines or blood thinners.  Taking medicines such as aspirin and ibuprofen. These medicines can thin your blood. Do not take these medicines before your procedure if your health care provider instructs you not to.  Tests and exams  You will have a physical exam.  You may have blood tests done to show: ? How well your kidneys and liver are working. ? How well your blood can clot.  General instructions  Plan to have someone take you home from the hospital or clinic.  If you will be going home right after the procedure, plan to have someone with you for 24 hours.  What happens during the procedure?  Your blood pressure, heart rate, breathing, level of pain and overall  condition will be monitored.  An IV tube will be inserted into one of your veins.  Your anesthesia specialist will give you medicines as needed to keep you comfortable during the procedure. This may mean changing the level of sedation.  The procedure will be performed. After the procedure  Your blood pressure, heart rate, breathing rate, and blood oxygen level will be monitored until the medicines you were given have worn off.  Do not drive for 24 hours if you received a sedative.  You may: ? Feel sleepy, clumsy, or nauseous. ? Feel forgetful about what happened after the procedure. ? Have a sore throat if you had a  breathing tube during the procedure. ? Vomit. This information is not intended to replace advice given to you by your health care provider. Make sure you discuss any questions you have with your health care provider. Document Released: 11/05/2004 Document Revised: 07/19/2015 Document Reviewed: 06/02/2015 Elsevier Interactive Patient Education  2018 [employer], Care After These instructions provide you with information about caring for yourself after your procedure. Your health care provider may also give you more specific instructions. Your treatment has been planned according to current medical practices, but problems sometimes occur. Call your health care provider if you have any problems or questions after your procedure. What can I expect after the procedure? After your procedure, it is common to:  Feel sleepy for several hours.  Feel clumsy and have poor balance for several hours.  Feel forgetful about what happened after the procedure.  Have poor judgment for several hours.  Feel nauseous or vomit.  Have a sore throat if you had a breathing tube during the procedure.  Follow these instructions at home: For at least 24 hours after the procedure:   Do not: ? Participate in activities in which you could fall or become injured. ? Drive. ? Use heavy machinery. ? Drink alcohol. ? Take sleeping pills or medicines that cause drowsiness. ? Make important decisions or sign legal documents. ? Take care of children on your own.  Rest. Eating and drinking  Follow the diet that is recommended by your health care provider.  If you vomit, drink water, juice, or soup when you can drink without vomiting.  Make sure you have little or no nausea before eating solid foods. General instructions  Have a responsible adult stay with you until you are awake and alert.  Take over-the-counter and prescription medicines only as told by your health care  provider.  If you smoke, do not smoke without supervision.  Keep all follow-up visits as told by your health care provider. This is important. Contact a health care provider if:  You keep feeling nauseous or you keep vomiting.  You feel light-headed.  You develop a rash.  You have a fever. Get help right away if:  You have trouble breathing. This information is not intended to replace advice given to you by your health care provider. Make sure you discuss any questions you have with your health care provider. Document Released: 06/02/2015 Document Revised: 10/02/2015 Document Reviewed: 06/02/2015 Elsevier Interactive Patient Education  [employer].

## 2017-03-18 ENCOUNTER — Telehealth: Payer: Self-pay | Admitting: Gastroenterology

## 2017-03-18 ENCOUNTER — Encounter (HOSPITAL_COMMUNITY)
Admission: RE | Admit: 2017-03-18 | Discharge: 2017-03-18 | Disposition: A | Discharge status: Home / Self Care | Payer: Federal, State, Local not specified - PPO | Source: Ambulatory Visit | Attending: Gastroenterology | Admitting: Gastroenterology

## 2017-03-18 ENCOUNTER — Encounter (HOSPITAL_COMMUNITY): Payer: Self-pay

## 2017-03-18 NOTE — Telephone Encounter (Signed)
Spoke with pt spouse and she reports pt is not going to have procedure done TCS/EGD/DIL on 03/23/17. [facility] [place] [facility]. Called Eber JonesCarolyn and made aware to cancel procedure. Sending as an Financial plannerYI to [employer]lie

## 2017-03-18 NOTE — Telephone Encounter (Signed)
LMOVM at #'s listed in chart

## 2017-03-18 NOTE — Pre-Procedure Instructions (Signed)
Patient did not show for his PAT appointment. Called and left a message on his cell phone number listed. Called Darl PikesSusan at [facility] to let her know that we were unable to contact him.

## 2017-03-18 NOTE — Telephone Encounter (Signed)
Kim from [employer] called to say that patient did not show up for his pre op appointment and she has tried calling him and has left voice mail for him, but no reply from him.

## 2017-03-22 NOTE — Telephone Encounter (Signed)
Mailed letter for pt to call right away.

## 2017-03-22 NOTE — Telephone Encounter (Signed)
Noted regarding plans to have [facility].  Please emphasize to patient that he has other loose ends that need to be addressed. He has h/o dilated CBD. Since he was here last I see where he had another CT at our facility which shows subcentimeter pancreatic lesion which needs further evaluation.   Patient needs further evaluation of pancreatic lesion and dilation CBD. Given h/o elevated creatinine, he may not qualify for MRI therefore EUS may be needed. Please offer patient OV with [facility] to discuss.

## 2017-03-22 NOTE — Telephone Encounter (Signed)
I called all numbers listed. Work number not working. Left Vm for a return call at mobile and home numbers.

## 2017-03-23 ENCOUNTER — Encounter (HOSPITAL_COMMUNITY): Admission: RE | Payer: Self-pay | Source: Ambulatory Visit

## 2017-03-23 ENCOUNTER — Ambulatory Visit (HOSPITAL_COMMUNITY)
Admission: RE | Admit: 2017-03-23 | Payer: Federal, State, Local not specified - PPO | Source: Ambulatory Visit | Admitting: Gastroenterology

## 2017-03-23 SURGERY — ESOPHAGOGASTRODUODENOSCOPY (EGD) WITH PROPOFOL
Anesthesia: Monitor Anesthesia Care

## 2017-04-23 NOTE — Telephone Encounter (Signed)
Is there any way we can put specifics in the letter to the patient so he understands the importance of follow up for the following items.     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

## 2017-04-26 ENCOUNTER — Telehealth: Payer: Self-pay | Admitting: Gastroenterology

## 2017-04-26 NOTE — Telephone Encounter (Signed)
Pt is aware of the history of dilated common bile duct and new pancreatic lesion. He has been to the [facility] in [place] a couple of weeks ago and will be going back. He would just like for us to let them know about this and see if they can take care of it. He will come by tomorrow if needed to sign a release if needed.

## 2017-04-26 NOTE — Telephone Encounter (Signed)
I tried calling pt again.  Work number not working. Mobile, left VM to call for very important message. Home, left VM to call for very important message.  Mailed a new letter with more info as to why he needs to call.

## 2017-04-26 NOTE — Telephone Encounter (Signed)
Pt called to say his wife told him that DS had called and he was returning her call. Please call him back [phone]

## 2017-04-26 NOTE — Telephone Encounter (Signed)
Probably the best thing for him to do is take a copy of the CT report to his next appointment and he can let them know that we recommended further work up but he wants to have done by the [facility].   I can print a better copy off then what is currently scanned into the chart. See me first thing in the morning and I will get it together for you.

## 2017-04-27 NOTE — Telephone Encounter (Signed)
LMOM for a return call. Pt needs to come by and pick up the reports printed out by Tana CoastLeslie Lewis, PA and sign a release for other records to be [facility] if requested or needed.

## 2017-04-27 NOTE — Telephone Encounter (Signed)
See other phone note

## 2017-04-27 NOTE — Telephone Encounter (Signed)
I have printed multiple CT reports and highlighted areas of concern.

## 2017-04-28 NOTE — Telephone Encounter (Signed)
Pt came by the office and signed release with Darl PikesSusan. He wanted me to go over the info that Tana CoastLeslie Lewis, PA had printed off for him. He is aware he needs to follow up soon on the issues she stated. [facility] and been referred to GI there but not received an appt yet. He will call and try to speed up things and if he cannot get anywhere with them, he will definitely call us back to follow up.

## 2017-04-28 NOTE — Telephone Encounter (Signed)
Pt is aware and will be by for the paperwork and to sign release.

## 2017-04-29 NOTE — Telephone Encounter (Signed)
Noted. Thanks.

## 2017-04-30 NOTE — Telephone Encounter (Signed)
Noted  

## 2018-11-17 IMAGING — CT CT RENAL STONE PROTOCOL
2 of 7 series · 14 of 46 positions shown, 18 images · non-contrast
Comparison: Abdominal CT dated 12/28/2016

CLINICAL DATA: 56-year-old male with renal failure and weight loss
over the past few months.

EXAM:
CT ABDOMEN AND PELVIS WITHOUT CONTRAST
TECHNIQUE: Multidetector CT imaging of the abdomen and pelvis was performed
following the standard protocol without IV contrast.

[Series 5: axial st · axial · 0.92mm/px · z∈[+489,+884]mm · 11 of 93 slices shown, 15 images]
[im 9/93  soft-tissue]
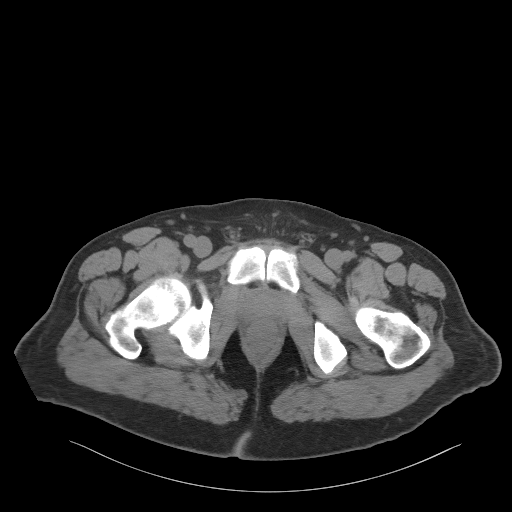
[im 9/93  bone]
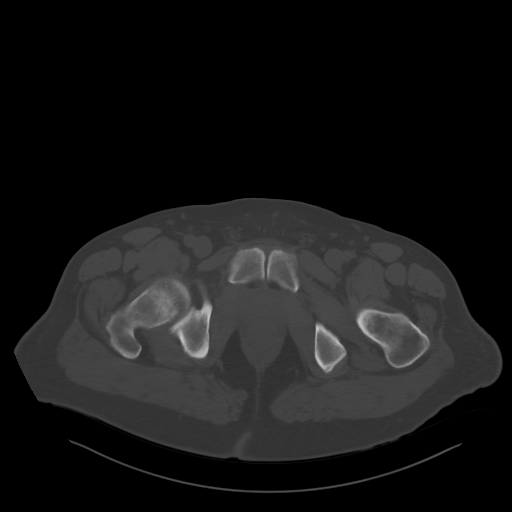
[im 17/93  soft-tissue]
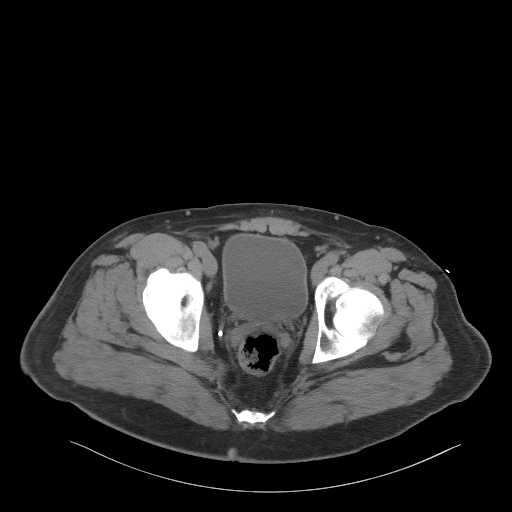
[im 26/93  soft-tissue]
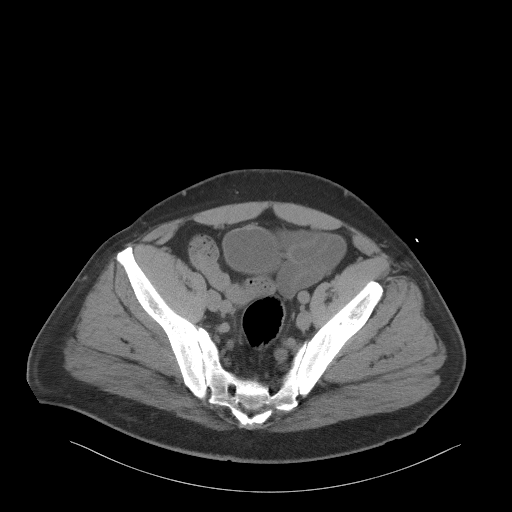
[im 38/93  soft-tissue]
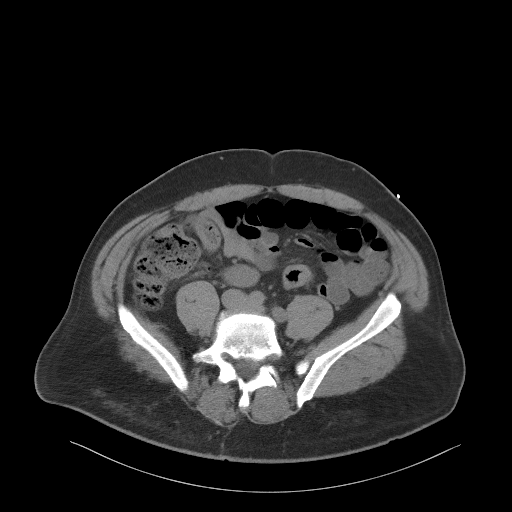
[im 47/93  soft-tissue]
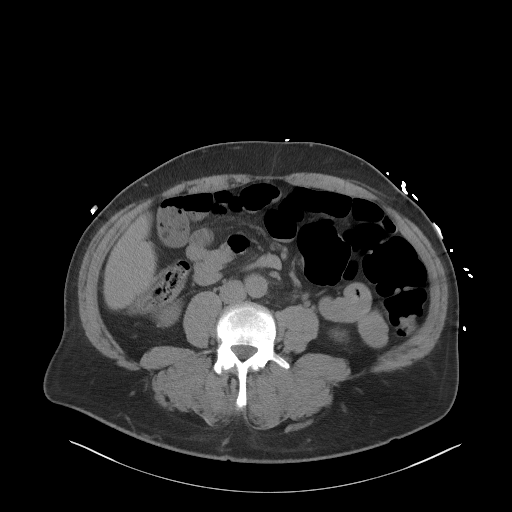
[im 55/93  soft-tissue]
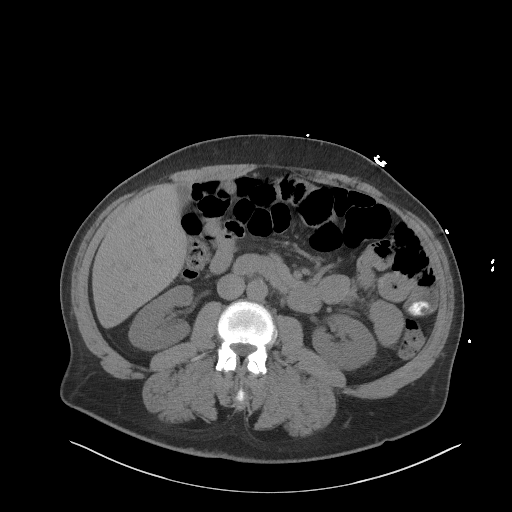
[im 67/93  soft-tissue]
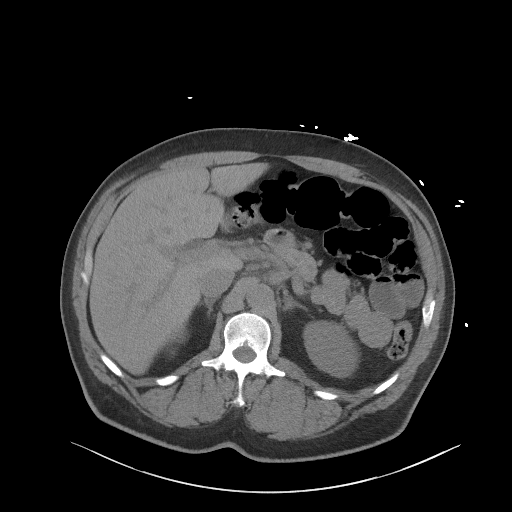
[im 76/93  soft-tissue]
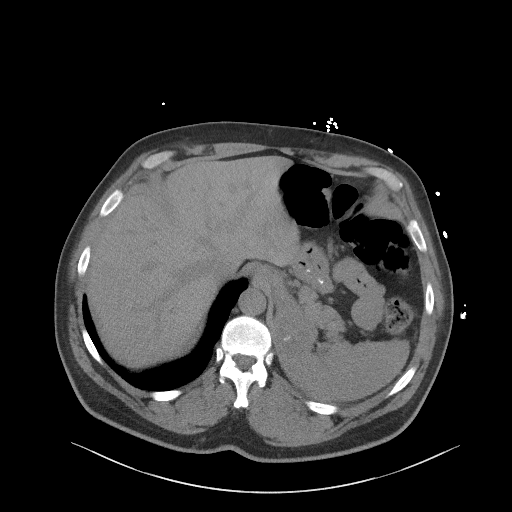
[im 76/93  lung]
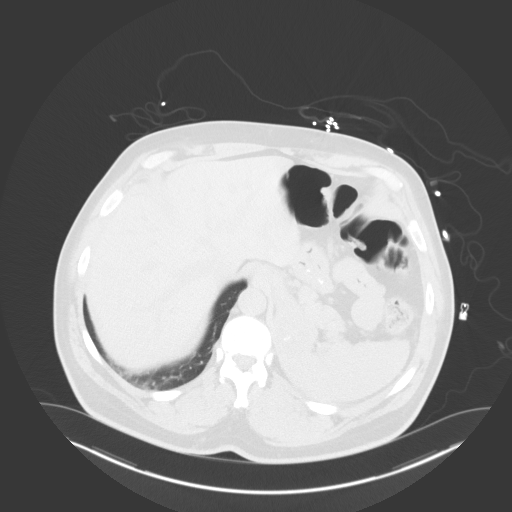
[im 80/93  lung]
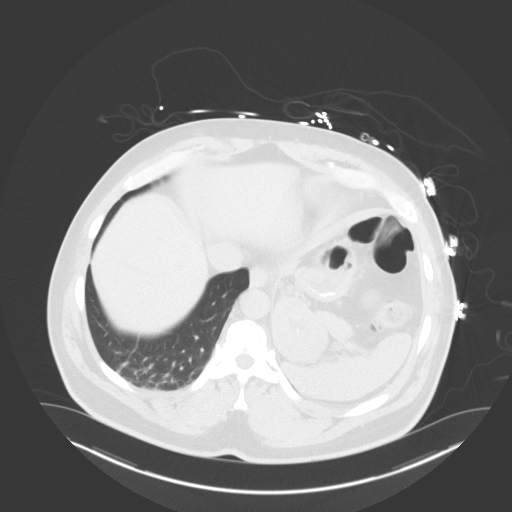
[im 84/93  soft-tissue]
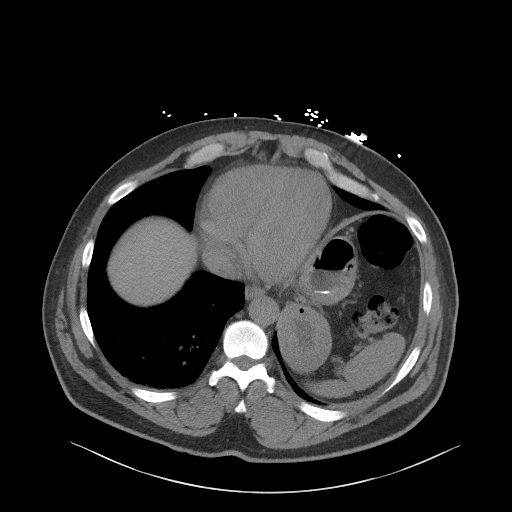
[im 84/93  lung]
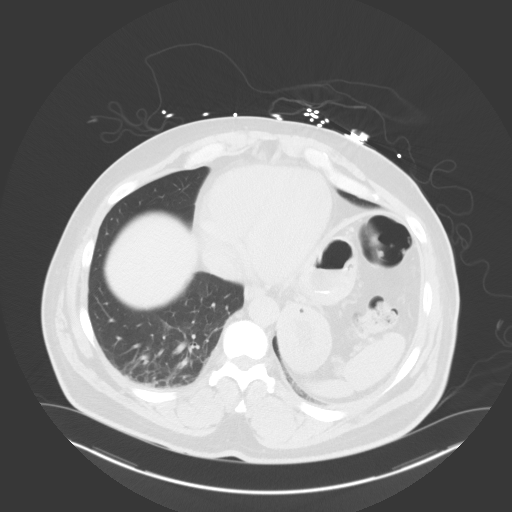
[im 84/93  bone]
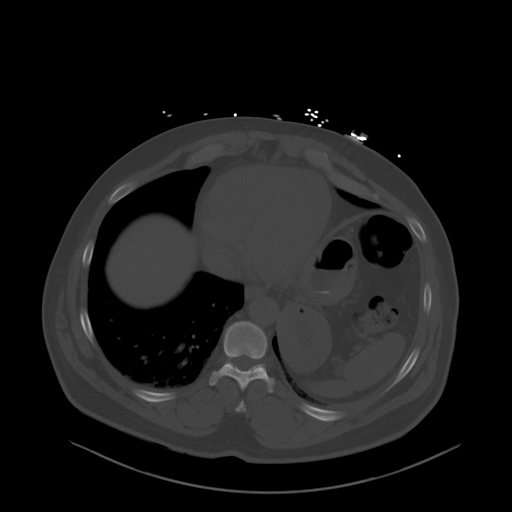
[im 88/93  lung]
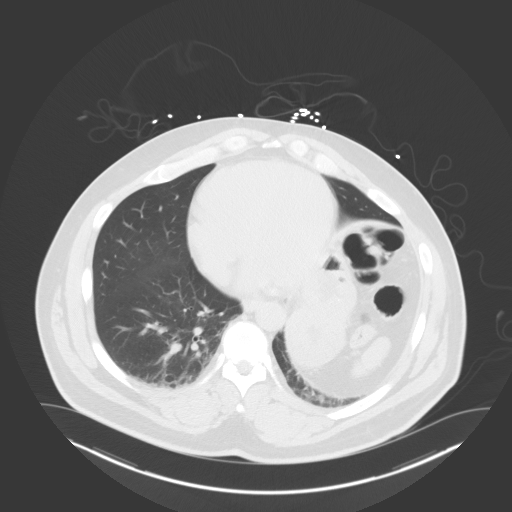

[Series 8: coronal st · coronal · 0.78mm/px · 3 of 101 slices shown]
[im 26/101  soft-tissue]
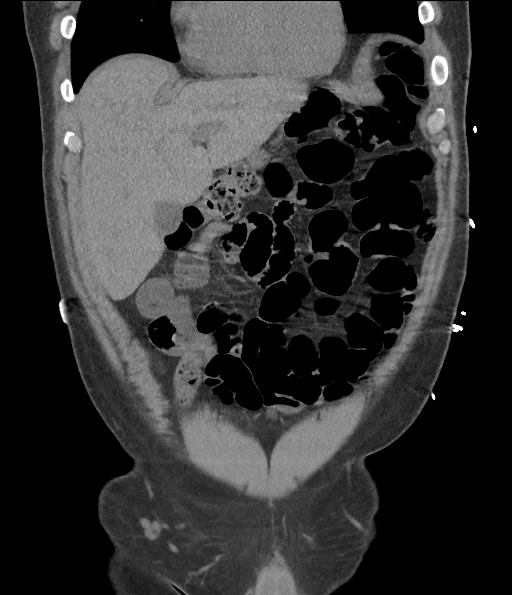
[im 51/101  soft-tissue]
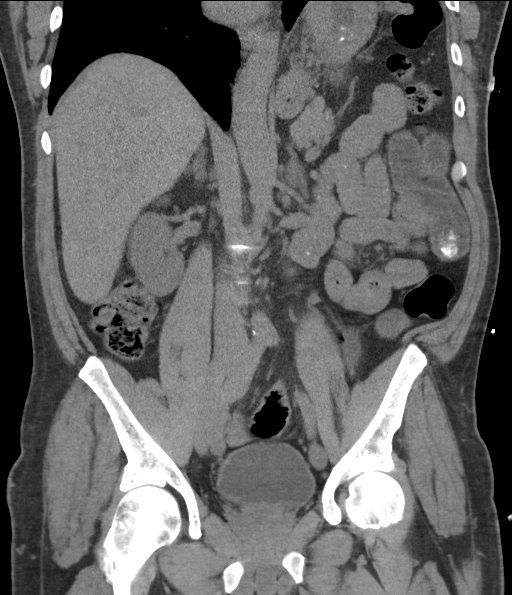
[im 76/101  soft-tissue]
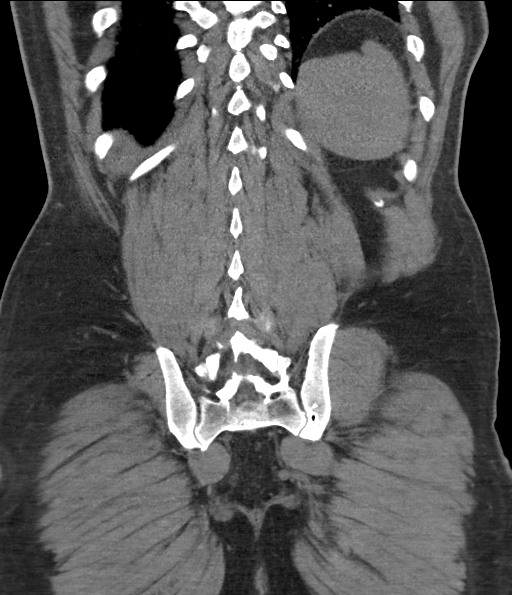

[14 of 46 positions shown; findings below may reference images not displayed]

FINDINGS: Evaluation of this exam is limited in the absence of intravenous
contrast.

Lower chest: There is eventration of the left hemidiaphragm. Minimal
bibasilar atelectatic changes.

No intra-abdominal free air or free fluid.

Hepatobiliary: The liver appears unremarkable. There is tumefactive
sludge versus noncalcified stone in the gallbladder (series 9, image
38 and coronal series 8, image 27). No pericholecystic fluid.

Pancreas: Subcentimeter hypodense lesion in the uncinate process of
the pancreas is not well characterized but may represent a side
branch IPMN. No inflammatory changes of the pancreas or dilatation
of the main pancreatic duct for

Spleen: Normal in size without focal abnormality.

Adrenals/Urinary Tract: The adrenal glands are unremarkable. There
is a 7 mm nonobstructing right renal inferior pole stone. No
hydronephrosis. A 1 cm left renal cyst. There is no hydronephrosis
or nephrolithiasis on the left. The visualized ureters and urinary
bladder appear unremarkable.

Stomach/Bowel: Thickened appearance of the bladder wall, likely
related to underdistention. Gastritis or underlying infiltrative
process are not excluded this finding is somewhat similar to prior
CT studies. Clinical correlation and continued follow-up
recommended. There is no bowel obstruction or active inflammation.
Normal appendix.

Vascular/Lymphatic: Mild aortoiliac atherosclerotic disease. The
abdominal aorta and IVC are grossly unremarkable on this noncontrast
CT. No portal venous gas. There is no adenopathy.

Reproductive: The prostate and seminal vesicles are grossly
unremarkable.

Other: None

Musculoskeletal: Degenerative changes at L5-S1. No acute osseous
pathology.
IMPRESSION: 1. A 7 mm nonobstructing right renal inferior pole stone. No
hydronephrosis.
2. Gallbladder tumefactive sludge versus noncalcified stone. No
pericholecystic fluid.
3. No bowel obstruction or active inflammation.  Normal appendix.
4.  Aortic Atherosclerosis ([id number]).
